# Patient Record
Sex: Female | Born: 1935 | Race: White | Hispanic: No | Smoking: Never smoker
Health system: Southern US, Community
[De-identification: ages and names within clinical notes are randomized; demographics above are authoritative.]

## PROBLEM LIST (undated history)

## (undated) DIAGNOSIS — I951 Orthostatic hypotension: Secondary | ICD-10-CM

## (undated) HISTORY — PX: NO PAST SURGERIES: SHX2092

## (undated) HISTORY — DX: Orthostatic hypotension: I95.1

---

## 2019-06-13 ENCOUNTER — Ambulatory Visit: Payer: Medicare HMO | Attending: Internal Medicine

## 2019-06-13 DIAGNOSIS — Z23 Encounter for immunization: Secondary | ICD-10-CM

## 2019-06-13 NOTE — Progress Notes (Signed)
   Covid-19 Vaccination Clinic  Name:  Anieya Helman [facility]    MRN: [id number] DOB: 10/06/1933  06/13/2019  Ms. Caughlin was observed post Covid-19 immunization for 15 minutes without incident. She was provided with Vaccine Information Sheet and instruction to access the V-Safe system.   Ms. Sottile was instructed to call 911 with any severe reactions post vaccine: Marland Kitchen Difficulty breathing  . Swelling of face and throat  . A fast heartbeat  . A bad rash all over body  . Dizziness and weakness   Immunizations Administered    Name Date Dose VIS Date Route   Pfizer COVID-19 Vaccine 06/13/2019  1:29 PM 0.3 mL 02/18/2019 Intramuscular   Manufacturer: [employer], [employer]   Lot: [id number]   NDC: [address]-8

## 2019-07-05 ENCOUNTER — Ambulatory Visit: Payer: Medicare HMO | Attending: Internal Medicine

## 2019-07-05 DIAGNOSIS — Z23 Encounter for immunization: Secondary | ICD-10-CM

## 2019-07-05 NOTE — Progress Notes (Signed)
   Covid-19 Vaccination Clinic  Name:  Lisa Bird [facility]    MRN: [id number] DOB: [birth year]  07/05/2019  Lisa Bird was observed post Covid-19 immunization for 15 minutes without incident. She was provided with Vaccine Information Sheet and instruction to access the V-Safe system.   Lisa Bird was instructed to call 911 with any severe reactions post vaccine: Marland Kitchen Difficulty breathing  . Swelling of face and throat  . A fast heartbeat  . A bad rash all over body  . Dizziness and weakness   Immunizations Administered    Name Date Dose VIS Date Route   Pfizer COVID-19 Vaccine 07/05/2019  1:22 PM 0.3 mL 05/04/2018 Intramuscular   Manufacturer: [employer], [employer]   Lot: [id number]   NDC: [address]-2

## 2019-09-18 DIAGNOSIS — I951 Orthostatic hypotension: Secondary | ICD-10-CM | POA: Insufficient documentation

## 2019-09-18 DIAGNOSIS — Z7189 Other specified counseling: Secondary | ICD-10-CM | POA: Insufficient documentation

## 2019-09-18 NOTE — Progress Notes (Signed)
Cardiology Office Note   Date:  09/19/2019   ID:  Lisa Bird, DOB 02-12-1934, MRN [id number]  PCP:  Hillery Aldo, PA-C  Cardiologist:   No primary care provider on file. Referring:  Juliet Rude*  Chief Complaint  Patient presents with  . Dizziness      History of Present Illness: Lisa Bird is a 86 y.o. female who was referred by Juliet Rude* for evaluation of orthostatic hypotension.  The patient has a remarkably benign past medical history and no cardiac history.  She has been having dizziness for the last several months.  She describes it as "feeling drunk."  She says that she gets lightheaded and feels like she can pass out.  It seems to be always at least in a seated position.  More often when she is standing and more often when she has a change in positions from lying to sitting to standing.  She will feel her heart racing.  She is actually had one episode of passing out with the hurt herself somewhat.  She is otherwise been able to avoid passing out by going to sit down quickly.  She is not having any chest pressure, neck or arm discomfort.  She is not having any shortness of breath, PND or orthopnea.  She was seen in the doctor's office recently and did have a noted orthostatic blood pressure drop of 22 mmHg.  She has been hydrating.  She does not really use a lot of salt.  She is not yet wearing compression stockings.  PMH: None  PSH:  None  Current Outpatient Medications  Medication Sig Dispense Refill  . Ascorbic Acid (VITAMIN C PO) Take by mouth.    Marland Kitchen VITAMIN D PO Take by mouth.     No current facility-administered medications for this visit.    Allergies:   Patient has no allergy information on record.    Social History:  The patient  reports that she has never smoked. She has never used smokeless tobacco.   Family History:  The patient's family history is noncontributory for early coronary artery  disease.  ROS:  Please see the history of present illness.   Otherwise, review of systems are positive for none.   All other systems are reviewed and negative.    PHYSICAL EXAM: VS:  BP 114/60   Pulse 70   Wt 96 lb 3.2 oz (43.6 kg)   SpO2 99%  , BMI There is no height or weight on file to calculate BMI. GENERAL:  Well appearing HEENT:  Pupils equal round and reactive, fundi not visualized, oral mucosa unremarkable NECK:  No jugular venous distention, waveform within normal limits, carotid upstroke brisk and symmetric, no bruits, no thyromegaly LYMPHATICS:  No cervical, inguinal adenopathy LUNGS:  Clear to auscultation bilaterally BACK:  No CVA tenderness CHEST:  Unremarkable HEART:  PMI not displaced or sustained,S1 and S2 within normal limits, no S3, no S4, no clicks, no rubs, no murmurs ABD:  Flat, positive bowel sounds normal in frequency in pitch, no bruits, no rebound, no guarding, no midline pulsatile mass, no hepatomegaly, no splenomegaly EXT:  2 plus pulses throughout, no edema, no cyanosis no clubbing SKIN:  No rashes no nodules NEURO:  Cranial nerves II through XII grossly intact, motor grossly intact throughout PSYCH:  Cognitively intact, oriented to person place and time    EKG:  EKG is ordered today. The ekg ordered today demonstrates sinus rhythm, rate 70,  axis within normal limits, intervals within normal limits, no acute ST-T wave changes.   Recent Labs: No results found for requested labs within last 8760 hours.    Lipid Panel No results found for: CHOL, TRIG, HDL, CHOLHDL, VLDL, LDLCALC, LDLDIRECT    Wt Readings from Last 3 Encounters:  09/19/19 96 lb 3.2 oz (43.6 kg)      Other studies Reviewed: Additional studies/ records that were reviewed today include: Labs, officer records. Review of the above records demonstrates:  Please see elsewhere in the note.     ASSESSMENT AND PLAN:  ORTHOSTATIC HYPOTENSION:   The patient clearly has orthostatic  hypotension symptoms and the documented blood pressure drop in her primary care office.  There is no clear obvious etiology to this and it is probably primary orthostasis.  Labs are thus far unremarkable although TSH is pending.  Electrolytes are unremarkable.  She was not anemic.  She would prefer conservative management.  I had a long discussion with her and her daughter about this.  She agrees to salt utilization and hydration.  She was given instructions on getting at least knee-high compression stockings and considering other.  She agrees to come back in 1 month and if she still having the symptoms and not able to avoid significant episodes that she would agree probably to midodrine.  She does mention some tachycardia but I do not think this is a primary event.  If she still mentioning this when she comes back she should probably have an event monitor.  We need to follow-up on the labs ordered by her primary provider when they are all available.  COVID EDUCATION: She has had vaccines.   Current medicines are reviewed at length with the patient today.  The patient does not have concerns regarding medicines.  The following changes have been made:  no change  Labs/ tests ordered today include: None  Orders Placed This Encounter  Procedures  . EKG 12-Lead     Disposition:   FU with APP in one month.     Signed, Rollene Rotunda, MD  09/19/2019 6:04 PM    [facility]

## 2019-09-19 ENCOUNTER — Ambulatory Visit: Payer: Medicare HMO | Admitting: Cardiology

## 2019-09-19 ENCOUNTER — Encounter: Payer: Self-pay | Admitting: Cardiology

## 2019-09-19 ENCOUNTER — Other Ambulatory Visit: Payer: Self-pay

## 2019-09-19 VITALS — BP 114/60 | HR 70 | Wt 96.2 lb

## 2019-09-19 DIAGNOSIS — I951 Orthostatic hypotension: Secondary | ICD-10-CM

## 2019-09-19 DIAGNOSIS — Z7189 Other specified counseling: Secondary | ICD-10-CM

## 2019-09-19 NOTE — Patient Instructions (Signed)
Medication Instructions:  The current medical regimen is effective;  continue present plan and medications.  *If you need a refill on your cardiac medications before your next appointment, please call your pharmacy*   Follow-Up: At [facility], you and your health needs are our priority.  As part of our continuing mission to provide you with exceptional heart care, we have created designated Provider Care Teams.  These Care Teams include your primary Cardiologist (physician) and Advanced Practice Providers (APPs -  Physician Assistants and Nurse Practitioners) who all work together to provide you with the care you need, when you need it.  We recommend signing up for the patient portal called "MyChart".  Sign up information is provided on this After Visit Summary.  MyChart is used to connect with patients for Virtual Visits (Telemedicine).  Patients are able to view lab/test results, encounter notes, upcoming appointments, etc.  Non-urgent messages can be sent to your provider as well.   To learn more about what you can do with MyChart, go to [email or web address].    Your next appointment:   1 month(s)  The format for your next appointment:   In Person  Provider:   You may see Dr.Hochrein or one of the following Advanced Practice Providers on your designated Care Team:    Theodore Demark, PA-C  Joni Reining, DNP, ANP  Cadence Fransico Michael, PA-C

## 2019-09-26 ENCOUNTER — Ambulatory Visit: Payer: Medicare HMO | Admitting: Neurology

## 2019-09-26 ENCOUNTER — Other Ambulatory Visit: Payer: Self-pay

## 2019-09-26 ENCOUNTER — Encounter: Payer: Self-pay | Admitting: Neurology

## 2019-09-26 VITALS — BP 110/59 | HR 70 | Ht 63.0 in | Wt 99.0 lb

## 2019-09-26 DIAGNOSIS — W19XXXA Unspecified fall, initial encounter: Secondary | ICD-10-CM

## 2019-09-26 DIAGNOSIS — R4189 Other symptoms and signs involving cognitive functions and awareness: Secondary | ICD-10-CM

## 2019-09-26 DIAGNOSIS — G3184 Mild cognitive impairment, so stated: Secondary | ICD-10-CM | POA: Diagnosis not present

## 2019-09-26 DIAGNOSIS — R55 Syncope and collapse: Secondary | ICD-10-CM

## 2019-09-26 DIAGNOSIS — G9340 Encephalopathy, unspecified: Secondary | ICD-10-CM | POA: Diagnosis not present

## 2019-09-26 DIAGNOSIS — F0391 Unspecified dementia with behavioral disturbance: Secondary | ICD-10-CM

## 2019-09-26 DIAGNOSIS — G309 Alzheimer's disease, unspecified: Secondary | ICD-10-CM

## 2019-09-26 DIAGNOSIS — R718 Other abnormality of red blood cells: Secondary | ICD-10-CM

## 2019-09-26 NOTE — Progress Notes (Addendum)
[facility]    Provider:  Dr Lucia GaskinsAhern Requesting Provider: Juliet RudeLivengood, Jessica J, P* Primary Care Provider:  Hillery AldoLivengood, Jessica J, PA-C  CC:  Mild cognitive impairement  HPI:  Lisa Bird is a 86 y.o. female here as requested by Hillery AldoLivengood, Jessica J, Demetrius CharityP* for mild cognitive impairment.  Past medical history orthostatic hypotension, mild cognitive impairment with reported memory issues from patient and daughter, unsteady gait.  I reviewed [employer] Levengood's notes, both patient and daughter reported that cognitive decline going on for "a while", patient forgetting things and repeats questions, sometimes patient thinks she has another house just like her somewhere else, and patient states she does think she has 2 houses but realizes this is probably not true, daughter worried patient may get lost driving, she has had eye examinations and vision is fine, MoCA score 18 out of 30.  CBC, CMP, B12, TSH and vitamin D were checked; TSH normal, CMP with BUN 14 and creatinine 0.82, CBC showed elevated MCV otherwise normal, B12 was 334, I do not see methylmalonic acid. Home health PT was consulted for unsteady gait, patient had a recent fall, unsteady gait and had 4 staples removed from the posterior head well-healed after fall, patient reported she fell outside, fell onto a curb-gutter in her right ribs and shoulder, she had increased pain with deep breath, no loss of consciousness; patient also stated she is always had "shakes", she hit the head in the bottom very hard, was alone at home bending over stood upright and fell.  Patient is here with her daughter who provides much information, they both agree she is recently been having memory concerns, she is forgetful of names, where she put things, has not been driving recently, she had a fall July 2 and they found that she was having orthostatic hypotension and is being followed by cardiology for that, syncopal event and head trauma due to  orthostasis. Daughter also provides information.   She feels dizzy a lot, she fell and fell backwards and her head hit twice, she had a laceration, she has had the dizziness for "a while" walking around and getting up is the worst, diagnosed with orthostatic hypotension, she has started drinking more fluids and eating better and her orthostasis is improved. She has had mood changes, especially since her husband passed, she had to move closer to family and move from her home town [place] and it has been hard for her. She went to a grief class a few times but never really healed from that. Her daughter cares for her. She says it is worse by herself. She does not want any medication. She isn't driving because they are afraid she may get lost, she doesn't know her way around. She sits at home, hard to get out to Covid and make friends. She moved last July to [place]. She is new to everything, she is forgetting new names, she says it isn't easy, she is repeating the same questions during the day and forgetting more than she should, she doesn't remember her [employer] class name even though she has been going multiple times, patient says she never learned the name. She is overwhelmed. No FHx of dementia. She thinks she has 2 identical houses. She appears to know and be embarassed by this and won't discuss with psychiatry and thought someone was living under her house. No personality changes. She has always been grumpy. She had a bad childhood.   Reviewed notes, labs and imaging from outside  physicians, which showed: see above   Review of Systems: Patient complains of symptoms per HPI as well as the following symptoms: "mild cognitive impairment". Pertinent negatives and positives per HPI. All others negative.   Social History   Socioeconomic History   Marital status: Widowed    Spouse name: Not on file   Number of children: Not on file   Years of education: Not on file   Highest education  level: Not on file  Occupational History   Not on file  Tobacco Use   Smoking status: Never Smoker   Smokeless tobacco: Never Used  Substance and Sexual Activity   Alcohol use: Not on file   Drug use: Not on file   Sexual activity: Not on file  Other Topics Concern   Not on file  Social History Narrative   Not on file   Social Determinants of Health   Financial Resource Strain:    Difficulty of Paying Living Expenses:   Food Insecurity:    Worried About Running Out of Food in the Last Year:    Merchant navy officer of Food in the Last Year:   Transportation Needs:    Freight forwarder (Medical):    Lack of Transportation (Non-Medical):   Physical Activity:    Days of Exercise per Week:    Minutes of Exercise per Session:   Stress:    Feeling of Stress :   Social Connections:    Frequency of Communication with Friends and Family:    Frequency of Social Gatherings with Friends and Family:    Attends Religious Services:    Active Member of Clubs or Organizations:    Attends Engineer, structural:    Marital Status:   Intimate Partner Violence:    Fear of Current or Ex-Partner:    Emotionally Abused:    Physically Abused:    Sexually Abused:     Family History  Problem Relation Age of Onset   Syncope episode Mother    Stroke Mother     Past Medical History:  Diagnosis Date   Orthostatic hypotension     Patient Active Problem List   Diagnosis Date Noted   Orthostatic hypotension 09/18/2019   Educated about COVID-19 virus infection 09/18/2019    No past surgical history on file.  Current Outpatient Medications  Medication Sig Dispense Refill   Ascorbic Acid (VITAMIN C PO) Take by mouth.     VITAMIN D PO Take by mouth.     No current facility-administered medications for this visit.    Allergies as of 09/26/2019   (Not on File)    Vitals: BP (!) 110/59    Pulse 70    Ht 5\' 3"  (1.6 m)    Wt 99 lb (44.9 kg)    BMI  17.54 kg/m  Last Weight:  Wt Readings from Last 1 Encounters:  09/26/19 99 lb (44.9 kg)   Last Height:   Ht Readings from Last 1 Encounters:  09/26/19 5\' 3"  (1.6 m)     Physical exam: Exam: Gen: NAD, conversant, well nourised, thin and frail looking, well groomed                     CV: RRR, no MRG. No Carotid Bruits. No peripheral edema, warm, nontender Eyes: Conjunctivae clear without exudates or hemorrhage  Neuro: Detailed Neurologic Exam  Speech:    Speech is normal; fluent and spontaneous with impaired comprehension.  Cognition:     MMSE - Mini  Mental State Exam 09/26/2019  Orientation to time 2  Orientation to Place 1  Registration 3  Attention/ Calculation 5  Recall 0  Language- name 2 objects 2  Language- repeat 1  Language- follow 3 step command 3  Language- read & follow direction 1  Write a sentence 1  Copy design 1  Total score 20    Cranial Nerves:    The pupils are equal, round, and reactive to light. Attempted fundoscopy could not visualize.  Visual fields are full to finger confrontation. Extraocular movements are intact. Trigeminal sensation is intact and the muscles of mastication are normal. The face is symmetric. The palate elevates in the midline. Hearing intact. Voice is normal. Shoulder shrug is normal. The tongue has normal motion without fasciculations.   Coordination:    Normal finger to nose and heel to shin.   Gait:    Heel-toe normal,  tandem gait with slight imbalance but overall intact   Motor Observation:    No asymmetry, no atrophy, and no involuntary movements noted. Tone:    Normal muscle tone.    Posture:    Posture is normal. normal erect    Strength:    Strength is V/V in the upper and lower limbs.      Sensation: intact to LT     Reflex Exam:  DTR's: Absent AJs otherwise deep tendon reflexes in the upper and lower extremities are normal bilaterally.   Toes:    The toes are equiv bilaterally.   Clonus:    Clonus  is absent.    Assessment/Plan:  86 y.o. female here as requested by Hillery Aldo, P* for mild cognitive impairment.  Past medical history orthostatic hypotension, mild cognitive impairment with reported memory issues from patient and daughter, unsteady gait.  I reviewed [employer] notes, both patient and daughter reported that cognitive decline going on for "a while", patient forgetting things and repeats questions, sometimes patient thinks she has another house just like her somewhere else, and patient states she does think she has 2 houses but realizes this is probably not true, daughter worried patient may get lost driving, she has had eye examinations and vision is fine, MoCA score 18 out of 30.MMSE 20/30.   Patient does appear to have cognitive difficulties more than stated age, I discussed with daughter alone, I do not think patient should be driving but also discussed formal driving testing in the future if patient improves. Her delusions are also concerning, can be first seen in Lewy Body dementia, cannot rule out Vascular dementia or Alzheimer's at this time. She also has depression and mood disorder, husband past away and she moved a year ago.   -Recommend Therapy for her grief and possible depression/mood disorder - Discussed formal memory testing - Need MRI of the brain - discussed formal driving eval to consider in the future - Discussed PET scan and gave information (FDG PET Scan can help distinguish different types of dementia) FTD vs Alzheimers vs lewy body - Gave lots of resources for [place], discussed silver sneakers, memory cafe and other dementia resources/packet - Discussed dementia - we will schedule follow up when workup completed - no driving at this time - she declines any medication - at this time still gathering mor einformation on the new [employer] drug - after workup we may consider clinical trial her ein our office  Orders Placed This Encounter    Procedures   MR BRAIN W WO CONTRAST   B12 and Folate  Panel   Methylmalonic acid, serum   Vitamin B1   Homocysteine   Basic Metabolic Panel   Ambulatory referral to Neuropsychology   No orders of the defined types were placed in this encounter.   Cc: Hillery Aldo, P*,  Hillery Aldo, PA-C  Naomie Dean, MD  [facility] [address] [place], [place] [address]  Phone [phone] Fax [phone]  I spent 90 minutes of face-to-face and non-face-to-face time with patient on the  1. Mild cognitive impairment   2. Elevated MCV   3. Syncope, unspecified syncope type   4. Fall, initial encounter   5. Encephalopathy    diagnosis.  This included previsit chart review, lab review, study review, order entry, electronic health record documentation, patient education on the different diagnostic and therapeutic options, counseling and coordination of care, risks and benefits of management, compliance, or risk factor reduction

## 2019-09-26 NOTE — Patient Instructions (Addendum)
Formal memory testing - Dr. Marvetta Gibbons office Consider formal driving testing - gave information MRI of the brain and blood work - will schedule PET Scan (can tell the difference between different forms of dementia)   Alzheimer's Disease Alzheimer's disease is a brain disease that affects memory, thinking, language, and behavior. People with Alzheimer's disease lose mental abilities, and the disease gets worse over time. Alzheimer's disease is a form of dementia. What are the causes? This condition develops when a protein called beta-amyloid forms deposits in the brain. It is not known what causes these deposits to form. Alzheimer's disease may also be caused by a gene mutation that is inherited from one parent or both parents. A gene mutation is a harmful change in a gene. Not everyone who inherits the genetic mutation will get the disease. What increases the risk? You are more likely to develop this condition if you:  Are older than age 8.  Have a family history of dementia.  Have had a brain injury.  Have heart or blood vessel disease.  Have had a stroke.  Have high blood pressure or high cholesterol.  Have diabetes. What are the signs or symptoms? Symptoms of this condition may happen in three stages, which often overlap. Early stage In this stage, you may continue to be independent. You may still be able to drive, work, and be social. Symptoms in this stage include:  Minor memory problems, such as forgetting a name or what you read.  Difficulty with: ? Paying attention. ? Communicating. ? Doing familiar tasks. ? Problem solving or doing calculations. ? Following instructions. ? Learning new things.  Anxiety.  Social withdrawal.  Loss of motivation. Moderate stage In this stage, you will start to need care. Symptoms in this stage include:  Difficulty with expressing thoughts.  Memory loss that affects daily life. This can include forgetting: ? Your address  or phone number. ? Recent events that have happened. ? Parts of your personal history, such as where you went to school.  Confusion about where you are or what time it is.  Difficulty in judging distance.  Changes in personality, mood, and behavior. You may be moody, irritable, angry, frustrated, fearful, anxious, or suspicious.  Poor reasoning and judgment.  Delusions or hallucinations.  Changes in sleep patterns.  Wandering and getting lost, even in familiar places. Severe stage In the final stage, you will need help with your personal care and daily activities. Symptoms in this stage include:  Worsening memory loss.  Personality changes.  Loss of awareness of your surroundings.  Changes in physical abilities, including the ability to walk, sit, and swallow.  Difficulty in communicating.  Inability to control your bladder and bowels.  Increasing confusion.  Increasing behavior changes. How is this diagnosed? This condition is diagnosed by a health care provider who specializes in diseases of the nervous system (neurologist). Other causes of dementia may also be ruled out. Your health care provider will talk with you and your family, friends, or caregivers about your history and symptoms. A thorough medical history will be taken, and you will have a physical exam and tests. Tests may include:  Lab tests, such as blood or urine tests.  Imaging tests, such as a CT scan, a PET scan, or an MRI.  A lumbar puncture. This test involves removing and testing a small amount of the fluid that surrounds the brain and spinal cord.  An electroencephalogram (EEG). In this test, small metal discs are used to measure  electrical activity in the brain.  Memory tests, cognitive tests, and neuropsychological tests. These tests evaluate brain function.  Genetic testing may be done if you have early onset of the disease (before age 78) or if other family members have the disease. How is  this treated? At this time, there is no treatment to cure Alzheimer's disease or stop it from getting worse. The goals of treatment are:  To slow down symptoms of the disease, if possible.  To manage behavioral changes.  To provide you with a safe environment.  To help manage daily life for you and your caregivers. The following treatment options are available:  Medicines. Medicines may help to slow down memory loss and manage behavioral symptoms.  Cognitive therapy. Cognitive therapy provides you with education, support, and memory aids. It is most helpful in the early stages of the condition.  Counseling or spiritual guidance. It is normal to have a lot of feelings, including anger, relief, fear, and isolation. Counseling and guidance can help you deal with these feelings.  Caregiving. This involves having caregivers help you with your daily activities.  Family support groups. These provide education, emotional support, and information about community resources to family members who are taking care of you. Follow these instructions at home:  Medicines  Take over-the-counter and prescription medicines only as told by your health care provider.  Use a pill organizer or pill reminder to help you manage your medicines.  Avoid taking medicines that can affect thinking, such as pain medicines or sleeping medicines. Lifestyle  Make healthy lifestyle choices: ? Be physically active as told by your health care provider. Regular exercise may help improve symptoms. ? Do not use any products that contain nicotine or tobacco, such as cigarettes, e-cigarettes, and chewing tobacco. If you need help quitting, ask your health care provider. ? Do not drink alcohol. ? Eat a healthy diet. ? Practice stress-management techniques when you get stressed. ? Stay social.  Drink enough fluid to keep your urine pale yellow.  Make sure to get quality sleep. ? Avoid taking long naps during the day.  Take short naps of 30 minutes or less if needed. ? Keep your sleeping area dark and cool. ? Avoid exercising during the few hours before you go to bed. ? Avoid caffeine products in the afternoon and evening. General instructions  Work with your health care provider to determine what you need help with and what your safety needs are.  If you were given a bracelet that identifies you as a person with memory loss or tracks your location, make sure to wear it at all times.  Talk with your health care provider about whether it is safe for you to drive.  Work with your family to make important decisions, such as advance directives, medical power of attorney, or a living will.  Keep all follow-up visits as told by your health care provider. This is important. Where to find more information  The Alzheimer's Association: Call the 24-hour helpline at (267)802-8046, or visit LimitLaws.hu Contact a health care provider if:  You have nausea, vomiting, or trouble with eating.  You have dizziness or weakness.  You or your family members become concerned for your safety. Get help right away if:  You feel depressed or sad, or feel that you want to harm yourself.  You develop chest pain or difficulty with breathing.  You pass out. If you ever feel like you may hurt yourself or others, or have thoughts about taking  your own life, get help right away. You can go to your nearest emergency department or call:  Your local emergency services (911 in the [place]  A suicide crisis helpline, such as the National Suicide Prevention Lifeline at 807 192 1613. This is open 24 hours a day. Summary  Alzheimer's disease is a brain disease that affects memory, thinking, language, and behavior. Alzheimer's disease is a form of dementia.  This condition is diagnosed by a specialist in diseases of the nervous system (neurologist).  At this time, there is no treatment to cure Alzheimer's disease or stop it from  getting worse. The goals of treatment are to slow memory loss and help you manage any symptoms.  Work with your family to make important decisions, such as advance directives, medical power of attorney, or a living will. This information is not intended to replace advice given to you by your health care provider. Make sure you discuss any questions you have with your health care provider. Document Revised: 02/02/2018 Document Reviewed: 02/02/2018 Elsevier Patient Education  2020 [employer]  Lewy Body Dementia Lewy body dementia, also called dementia with Lewy bodies, is a condition that affects the way the brain functions. It is one form of dementia. In this condition, proteins called Lewy bodies build up in certain areas of the brain. This causes problems with:  Memory.  Decision making.  Behavior.  Speaking.  Thinking.  Movements and balance.  Problem solving. This condition is progressive, which means that it gets worse with time (is degenerative) and cannot be reversed. What are the causes? This condition is caused by the buildup of Lewy bodies in brain cells in areas of the brain that control memory, thinking, and movement. It is not known what causes the Lewy bodies to build up. What increases the risk? You are more likely to develop this condition if you:  Have a family history of Lewy body dementia or Parkinson's disease.  Are 23 years old or older.  Are female. What are the signs or symptoms? Symptoms of this condition may include:  Symptoms of dementia, such as: ? Trouble with memory. ? Trouble paying attention. ? Problems with planning and organizing. ? Problems with judgment. ? Behavioral problems.  Symptoms of Parkinson's disease, such as: ? Shaking movements that you cannot control (tremor). Tremors usually start in a hand or foot when you are resting (resting tremor). ? Stooped posture. ? Slowing of movement. ? Stiff muscles (rigidity). ? Loss of  balance and stability when standing.  Seeing things that are not there (hallucinating).  Changes in memory, attention, and concentration that come and go (fluctuation).  Sleep problems, such as acting out dreams while you are asleep. How is this diagnosed? This condition is diagnosed by a specialist who diagnoses and treats this condition (neurologist). Your health care provider will talk with you and your family, friends, or caregivers about your history and symptoms. A thorough medical history will be taken, and you will have a physical exam and tests. Tests may include:  Lab tests, such as blood or urine tests.  Imaging tests, such as a CT scan, a PET scan, or an MRI.  A test that involves removing and testing a small amount of the fluid that surrounds the brain and spinal cord (lumbar puncture).  A test where small metal discs are used to measure electrical activity in the brain (electroencephalogram or EEG).  Tests that evaluate brain function, such as memory tests, cognitive tests, and neuropsychological tests. How is this  treated? There is no cure for this condition. Treatment focuses on managing your symptoms. Treatment may include:  Medicines. Everyone responds to medicines differently. Your response may change over time. Work with your health care provider to find the best medicines for you.  Speech, occupational, and physical therapy. Your health care provider can help direct you to support groups, organizations, and other health care providers who can help with decisions about your care. Follow these instructions at home: Medicine  Take over-the-counter and prescription medicines only as told by your health care provider.  To help you manage your medicines, use a pill organizer or pill reminder.  Avoid taking medicines that can affect thinking, such as pain medicines or sleeping medicines. Lifestyle  Make healthy lifestyle choices: ? Be physically active as told by  your health care provider. ? Do not use any products that contain nicotine or tobacco, such as cigarettes, e-cigarettes, and chewing tobacco. If you need help quitting, ask your health care provider. ? Try to practice stress-management techniques when you experience stress, such as mindfulness, yoga, or deep breathing. ? Stay socially connected. Talk regularly with other people, such as family, friends, and neighbors.  Make sure you sleep well. These tips can help you get a good night's rest: ? Avoid napping during the day. ? Keep your sleeping area dark and cool. ? Avoid exercising a few hours before you go to bed. ? Avoid caffeine products in the evening. Eating and drinking  Do not drink alcohol.  Drink enough fluid to keep your urine pale yellow.  Eat a healthy diet. Safety      Work with your health care provider to determine what you need help with and what your safety needs are.  If you have trouble moving around, use a cane or walker as told by your health care provider.  Make sure your home environment is safe. To do this: ? Remove things that can be a tripping hazard, such as throw rugs or clutter. ? Install grab bars and railings in your home to prevent falls.  Talk with your health care provider about if it is safe for you to drive.  If you were given a bracelet that identifies you as a person with memory loss or tracks your location, make sure to wear it at all times. General instructions  Work with your family to make important decisions, such as advance directives, medical power of attorney, or a living will.  Keep all follow-up visits as told by your health care provider. This is important. Contact a health care provider if you have:  A fever.  Problems with choking or swallowing.  Any symptoms of a new or different illness.  New or worsening trouble with sleeping or increased daytime sleepiness.  New or worsening confusion. Get help right away  if:  You feel depressed, sad, or feel that you want to harm yourself.  Your family members become concerned for your safety. If you ever feel like you may hurt yourself or others, or have thoughts about taking your own life, get help right away. You can go to your nearest emergency department or call:  Your local emergency services (911 in the [place]  A suicide crisis helpline, such as the National Suicide Prevention Lifeline at 9282433744. This is open 24 hours a day. Summary  Dementia is a condition that affects the way the brain functions. It often affects memory and thinking.  Lewy body dementia is a degenerative dementia.  This condition is  caused by the buildup of proteins called Lewy bodies in brain cells. It is not known what causes the Lewy bodies to build up.  Work with your health care provider to determine what you need help with and what your safety needs are.  Your health care provider can help direct you to support groups, organizations, and other health care providers who can help with decisions about your care. This information is not intended to replace advice given to you by your health care provider. Make sure you discuss any questions you have with your health care provider. Document Revised: 04/22/2018 Document Reviewed: 04/22/2018 Elsevier Patient Education  2020 [employer].  Vascular Dementia Dementia is a condition in which a person has problems with thinking, memory, and behavior that are severe enough to interfere with daily life. Vascular dementia is a type of dementia. It results from brain damage that is caused by the brain not getting enough blood. This condition may also be called vascular cognitive impairment. What are the causes? Vascular dementia is caused by conditions that lessen blood flow to the brain. Common causes of this condition include:  Multiple small strokes. These may happen without symptoms (silent stroke).  Major  stroke.  Damage to small blood vessels in the brain (cerebral small vessel disease). What increases the risk? The following factors may make you more likely to develop this condition:  Having had a stroke.  Having high blood pressure (hypertension) or high cholesterol.  Having a disease that affects the heart or blood vessels.  Smoking.  Having diabetes.  Having metabolic syndrome.  Being obese.  Not being active.  Having depression.  Being over age 63. What are the signs or symptoms? Symptoms can vary from one person to another. Symptoms may be mild or severe depending on the amount of damage and which parts of the brain have been affected. Symptoms may begin suddenly or may develop slowly. Mental symptoms of vascular dementia may include:  Confusion.  Memory problems.  Poor attention and concentration.  Trouble understanding speech.  Depression.  Personality changes.  Trouble recognizing familiar people.  Agitation or aggression.  Paranoia.  Delusions or hallucinations. Physical symptoms of vascular dementia may include:  Weakness.  Poor balance.  Loss of bladder or bowel control (incontinence).  Unsteady walking (gait).  Speaking problems. Behavioral symptoms of vascular dementia may include:  Getting lost in familiar places.  Problems with planning and judgment.  Trouble following instructions.  Social problems.  Emotional outbursts.  Trouble with daily activities and self-care.  Problems handling money. Symptoms may remain stable, or they may get worse over time. Symptoms of vascular dementia may be similar to those of Alzheimer's disease. The two conditions can occur together (mixed dementia). How is this diagnosed? Your health care provider will consider your medical history and symptoms or changes that are reported by friends and family. Your health care provider will do a physical exam and may order lab tests or other tests that  check brain and nervous system function. Tests that may be done include:  Blood tests.  Brain imaging tests.  Tests of movement, speech, and other daily activities (neurological exam).  Tests of memory, thinking, and problem-solving (neuropsychological or neurocognitive testing). There is not a specific test to diagnose vascular dementia. Diagnosis may involve several specialists. These may include:  A health care provider who specializes in the brain and nervous system (neurologist).  A health provider who specializes in understanding how problems in the brain can alter behavior  and cognitive function (neuropsychologist). How is this treated? There is no cure for vascular dementia. Brain damage that has already occurred cannot be reversed. Treatment depends on:  How severe the condition is.  Which parts of your brain have been affected.  Your overall health. Treatment measures aim to:  Treat the underlying cause of vascular dementia and manage risk factors. This may include: ? Controlling blood pressure. ? Lowering cholesterol. ? Treating diabetes. ? Quitting smoking. ? Losing weight or maintaining a healthy weight. ? Eating a healthy, balanced diet. ? Getting regular exercise.  Manage symptoms.  Prevent further brain damage.  Improve the person's health and quality of life. Treatment for dementia may involve a team of health care providers, including:  A neurologist.  A provider who specializes in disorders of the mind (psychiatrist).  A provider who specializes in helping people learn daily living skills (occupational therapist).  A provider who focuses on speech and language changes (Doctor, general practice).  A heart specialist (cardiologist).  A provider who helps people learn how to manage physical changes, such as movement and walking (exercise physiologist or physical therapist). Follow these instructions at home: Lifestyle  People with vascular dementia may  need regular help at home or daily care from a family member or home health care worker. Home care for a person with vascular dementia depends on what caused the condition and how severe the symptoms are. General guidelines for caregivers include:  Help the person with dementia remember people, appointments, and daily activities.  Help the person with dementia manage his or her medicines.  Help family and friends learn about ways to communicate with the person with dementia.  Create a safe living space to reduce the risk of injury or falls.  Find a support group to help caregivers and family cope with the effects of dementia.  General instructions  Help the person take over-the-counter and prescription medicines only as told by the health care provider.  Follow the health care provider's instructions for treating the condition that caused the dementia.  Make sure the person keeps all follow-up visits as told by the health care provider. This is important. Contact a health care provider if:  A fever develops.  New behavioral problems develop.  Problems with swallowing develop.  Confusion gets worse.  Sleepiness gets worse. Get help right away if:  Loss of consciousness occurs.  There is a sudden loss of speech, balance, or thinking ability.  New numbness or paralysis occurs.  Sudden, severe headache occurs.  Vision is lost or suddenly gets worse in one or both eyes. Summary  Vascular dementia is a type of dementia. It results from brain damage that is caused by the brain not getting enough blood.  Vascular dementia is caused by conditions that lessen blood flow to the brain. Common causes of this condition include stroke and damage to small blood vessels in the brain.  Treatment focuses on treating the underlying cause of vascular dementia and managing any risk factors.  People with vascular dementia may need regular help at home or daily care from a family member or  home health care worker.  Contact a health care provider if you or your caregiver notice any new symptoms. This information is not intended to replace advice given to you by your health care provider. Make sure you discuss any questions you have with your health care provider. Document Revised: 11/25/2017 Document Reviewed: 11/26/2017 Elsevier Patient Education  2020 [employer].  Recommendations to prevent or slow  progression of cognitive decline:   Exercise You should increase exercise 30 to 45 minutes per day at least 3 days a week although 5 to 7 would be preferred. Any type of exercise (including walking) is acceptable although a recumbent bicycle may be best if you are unsteady. Disease related apathy can be a significant roadblock to exercise and the only way to overcome this is to make it a daily routine and perhaps have a reward at the end (something your loved one loves to eat or drink perhaps) or a personal trainer coming to the home can also be very useful. In general a structured, repetitive schedule is best.   Cardiovascular Health: You should optimize all cardiovascular risk factors (blood pressure, sugar, cholesterol) as vascular disease such as strokes and heart attacks can make memory problems much worse.   Diet: Eating a heart healthy (Mediterranean) diet is also a good idea; fish and poultry instead of red meat, nuts (mostly non-peanuts), vegetables, fruits, olive oil or canola oil (instead of butter), minimal salt (use other spices to flavor foods), whole grain rice, bread, cereal and pasta and wine in moderation.  General Health: Any diseases which effect your body will effect your brain such as a pneumonia, urinary infection, blood clot, heart attack or stroke. Keep contact with your primary care doctor for regular follow ups.  Sleep. A good nights sleep is healthy for the brain. Seven hours is recommended. If you have insomnia or poor sleep habits see the recommendations  below  Tips: Structured and consistent daytime and nighttime routine, including regular wake times, bedtimes, and mealtimes, will be important for the patient to avoid confusion. Keeping frequently used items in designated places will help reduce stress from searching. If there are worries about getting lost do not let the patient leave home unaccompanied. They might benefit from wearing an identification bracelet that will help others assist in finding home if they become lost. Information about nationwide safe return services and other helpful resources may be obtained through the Alzheimer's Association helpline at 1800-(702) 662-9721.  Finances, Power of Restaurant manager, fast food Directives: You should consider putting legal safeguards in place with regard to financial and medical decision making. While the spouse always has power of attorney for medical and financial issues in the absence of any form, you should consider what you want in case the spouse / caregiver is no longer around or capable of making decisions.   [place] : MovieEvening.com.au.pdf  Or Google "[facility]" AND "An Proofreader for [employer]  Other States: PainBasics.com.au  The signature on these forms should be notarized.   DRIVING:   Driving only during the day Drive only to familiar Locations Avoid driving during bad weather  If you would like to be tested to see if you are driving safely, [facility] has a Clinical Driving Evaluation. To schedule an appointment call [phone].                RESOURCES:  Memory Loss: Improve your short term memory By Laverle Patter  The Alzheimer's Reading Room http://www.alzheimersreadingroom.com/   The Alzheimer's Compendium http://www.alzcompend.info/  [employer] www.dukefamilysupport.UJW 8311877442  Recommended  resources for caregivers (All can be purchased on [employer]):  1) A Caregiver's Guide to Dementia: Using Activities and Other Strategies to Prevent, Reduce and Manage Behavioral Symptoms by Mahala Menghini. [place] and [employer]   2) A Caregiver's Guide to Owens & Minor Dementia by Briant Sites MS BSN and Velia Meyer   3)  What If It's Not Alzheimer's?: A Caregiver's Guide to Dementia by Neila GearGary Radin (Author), Roberts GaudyLisa Radin (Editor)  3) The 36 hour day by Rabins and Mace  4) Understanding Difficult Behaviors by Gerre Scullobinson, Spencer and White  Online course for helping caregivers reduce stress, guilt and frustration called the Caregivers Helpbook. The website is www.powerfultoolsforcaregivers.org  As a caregiver you are a Government social research officerproblem-solver. Problems you face as a caregiver are usually unique to your situation and the way your loved-one's disease manifests itself. The best way to use these books is to look at the Table of Contents and read any chapters of interest or that apply to challenges you are having as a caregiver.  NATIONAL RESOURCES: For more information on neurological disorders or research programs funded by the General Millsational Institute of Neurological Disorders and Stroke, contact the Institute's GafferBrain Resources and Information Network (BRAIN) at: BRAIN [address] [place], [place] [id number] [phone] (toll-free) ToledoAutomobile.co.ukwww.ninds.nih.gov  Information on dementia is also available from the following organizations: Alzheimer's Disease Education and Referral (ADEAR) Center [employer]ational Institute on Aging [address] [place] [address] [phone] (toll-free) CashCowGambling.bewww.nia.nih.gov/alzheimers  Alzheimer's Association [address] [place], [place] [phone] (toll-free, 24-hour helpline) [phone] (TDD) LimitLaws.huwww.alz.org  Alzheimer's Foundation of [place] [address], 7th Floor [place], [place] [address] [phone] (toll-free) www.alzfdn.org   Alzheimer's Drug Discovery Foundation [address], [address] [place], [place] [address] [phone] www.alzdiscovery.org  Association for Frontotemporal Degeneration [address] [place], [place] [address] [phone] (toll-free) www.theaftd.[facility]  BrightFocus Foundation [address] [place], [place] [address] [phone] (toll-free) www.brightfocus.org/alzheimers  Earl GalaJohn Douglas French Alzheimer's Foundation [address], [address] [place], [place] [address] [phone] www.BushWebsites.nljdfaf.org  Lewy Body Dementia Association [address], [place], [place] [address] [phone] (toll-free LBD Caregiver Link) www.lbda.[facility]  National Institute of Mental Health [address], [address] [place], [place] [phone] [phone] (toll-free) [phone] [facility](TTY) http://www.maynard.net/www.nimh.nih.gov  National Organization for Rare Disorders [address] [place], [place] [address] [phone]) (toll-free) www.rarediseases.org  The Dementias: Hope Through Research was jointly produced by the General Millsational Institute of Neurological Disorders and Stroke (NINDS) and the General Millsational Institute on Aging (NIA), both part of the Occidental Petroleumational Institutes of Health, the H. J. Heinznation's medical research agency--supporting scientific studies that turn discovery into health. NINDS is the nation's leading funder of research on the brain and nervous system. The NINDS mission is to reduce the burden of neurological disease. For more information and resources, visit ToledoAutomobile.co.ukwww.ninds.nih.gov [1] or call [phone]. NIA leads the federal government effort conducting and supporting research on aging and the health and well-being of older people. NIA's Alzheimer's Disease Education and Referral (ADEAR) Center offers information and publications on dementia and caregiving for families, caregivers, and professionals. For more information, visit  CashCowGambling.bewww.nia.nih.gov/alzheimers [2] or call [phone]. Also available from NIA are publications and information about Alzheimer's disease as well as the booklets Frontotemporal Disorders: Information for Patients, Families, and Caregivers and Lewy Body Dementia: Information for Patients, Families, and Professionals. Source URL: VoiceZap.ishttp://www.nia.nih.gov/alzheimers/publication/dementias/resources

## 2019-09-27 LAB — SPECIMEN STATUS REPORT

## 2019-09-28 ENCOUNTER — Telehealth: Payer: Self-pay | Admitting: Neurology

## 2019-09-28 LAB — BASIC METABOLIC PANEL
BUN/Creatinine Ratio: 19 (ref 12–28)
BUN: 16 mg/dL (ref 8–27)
CO2: 24 mmol/L (ref 20–29)
Calcium: 9 mg/dL (ref 8.7–10.3)
Chloride: 106 mmol/L (ref 96–106)
Creatinine, Ser: 0.86 mg/dL (ref 0.57–1.00)
GFR calc Af Amer: 72 mL/min/{1.73_m2} (ref >59)
GFR calc non Af Amer: 63 mL/min/{1.73_m2} (ref >59)
Glucose: 92 mg/dL (ref 65–99)
Potassium: 4.3 mmol/L (ref 3.5–5.2)
Sodium: 141 mmol/L (ref 134–144)

## 2019-09-28 LAB — METHYLMALONIC ACID, SERUM: Methylmalonic Acid: 113 nmol/L (ref 0–378)

## 2019-09-28 LAB — HOMOCYSTEINE: Homocysteine: 10.9 umol/L (ref 0.0–21.3)

## 2019-09-28 LAB — VITAMIN B1

## 2019-09-28 LAB — B12 AND FOLATE PANEL
Folate: >20 ng/mL (ref >3.0)
Vitamin B-12: 938 pg/mL (ref 232–1245)

## 2019-09-28 NOTE — Telephone Encounter (Signed)
-----   Message from Anson Fret, MD sent at 09/27/2019  5:36 PM EDT ----- Labs normal thanks

## 2019-09-28 NOTE — Telephone Encounter (Signed)
Called the patient and there was no answer. LVM advising the pt of normal lab finding's. Instructed the patient to call back if she had questions.

## 2019-09-30 ENCOUNTER — Encounter: Payer: Self-pay | Admitting: Psychology

## 2019-10-26 ENCOUNTER — Ambulatory Visit
Admission: RE | Admit: 2019-10-26 | Discharge: 2019-10-26 | Disposition: A | Discharge status: Home / Self Care | Payer: Medicare HMO | Source: Ambulatory Visit | Attending: Neurology | Admitting: Neurology

## 2019-10-26 DIAGNOSIS — G9340 Encephalopathy, unspecified: Secondary | ICD-10-CM

## 2019-10-26 MED ORDER — GADOBENATE DIMEGLUMINE 529 MG/ML IV SOLN
9.0000 mL | Freq: Once | INTRAVENOUS | Status: AC | PRN
  Administered 2019-10-26: 9 mL via INTRAVENOUS

## 2019-10-27 ENCOUNTER — Ambulatory Visit: Payer: Medicare HMO | Admitting: Cardiology

## 2019-10-31 ENCOUNTER — Telehealth: Payer: Self-pay | Admitting: *Deleted

## 2019-10-31 NOTE — Telephone Encounter (Addendum)
I spoke with the pt's daughter Jo-Amber (on [place]) and discussed the results of the pt's MRI brain as noted below. She verbalized understanding and her questions were answered. Pt does have formal memory testing scheduled with Dr Kieth Brightly on 12/29/19. 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 The daughter also feels the pt should have the PET scan. She said lately the patient has gone downhill some and has been carrying a baby doll and is resistant to family correcting her and telling her its not real. I did recommend an evaluation by primary care for the recent change to be sure there is nothing medical (infections, etc) to explain the cognitive change. I let her know I would send a message to Dr Lucia Gaskins as well so she would be aware and could advise on the PET scan order. Jo-Amber verbalized appreciation.

## 2019-10-31 NOTE — Telephone Encounter (Signed)
-----   Message from Anson Fret, MD sent at 10/28/2019  8:16 AM EDT ----- Your brain has some findings that can be normal in aging. As we age we do have brain volume loss ("atrophy") and the "chronic small vessel ischemic disease" can also be seen in aging but can be also caused by high blood pressure, diabetes, high cholesterol, smoking, obesity and if severe can cause cognitive problems. No strokes or other concerning findings. At this time I recommend proceeding with formal memory testing or PET Scan as we discussed thanks. Dr Lucia Gaskins

## 2019-11-01 NOTE — Addendum Note (Signed)
Addended by: Naomie Dean B on: 11/01/2019 10:17 AM   Modules accepted: Orders

## 2019-11-01 NOTE — Telephone Encounter (Signed)
Ordered. thanks

## 2019-12-05 ENCOUNTER — Telehealth: Payer: Self-pay | Admitting: Neurology

## 2019-12-05 NOTE — Telephone Encounter (Signed)
Pt's daughter Letitia Neri called wanting to know the update on the MyChart message that was sent about the pt's DMV Paperwork.

## 2019-12-13 DIAGNOSIS — Z0279 Encounter for issue of other medical certificate: Secondary | ICD-10-CM

## 2019-12-15 ENCOUNTER — Telehealth: Payer: Self-pay | Admitting: Neurology

## 2019-12-15 ENCOUNTER — Telehealth: Payer: Self-pay | Admitting: *Deleted

## 2019-12-15 NOTE — Telephone Encounter (Signed)
Pt. Forms faxed to [facility].

## 2019-12-15 NOTE — Telephone Encounter (Signed)
Received Driver [employer] report (copy sent to medical records to be scanned). They recommend patient retire from driving. We also received DMV form from patient to complete.

## 2019-12-15 NOTE — Telephone Encounter (Signed)
DMV form completed, signed, and sent to medical records for processing.  

## 2019-12-29 ENCOUNTER — Encounter: Payer: Medicare HMO | Attending: Psychology | Admitting: Psychology

## 2019-12-29 ENCOUNTER — Other Ambulatory Visit: Payer: Self-pay

## 2019-12-29 ENCOUNTER — Encounter: Payer: Self-pay | Admitting: Psychology

## 2019-12-29 DIAGNOSIS — R4189 Other symptoms and signs involving cognitive functions and awareness: Secondary | ICD-10-CM | POA: Diagnosis present

## 2019-12-29 DIAGNOSIS — F0391 Unspecified dementia with behavioral disturbance: Secondary | ICD-10-CM | POA: Insufficient documentation

## 2019-12-29 DIAGNOSIS — R413 Other amnesia: Secondary | ICD-10-CM | POA: Diagnosis present

## 2019-12-29 NOTE — Progress Notes (Signed)
Neuropsychological Consultation   Patient:   Lisa Bird   DOB:   1933-04-25  MR Number:  [id number]  [facility]  [facility] [address] [id number] [facility] [place] [id number] Dept: [phone]           Date of Service:   12/29/2019  Start Time:   9 AM End Time:   11 AM  Today's visit consisted of a 1 hour and 15-minute face-to-face clinical interview that was conducted in my outpatient clinic office with the patient and her daughter present.  45 minutes were spent with records review, conceptualizing case, establishing plans for formal neuropsychological testing and report writing.  Provider/Observer:  Arley PhenixJohn Baleria Wyman, Psy.D.       Clinical Neuropsychologist       Billing Code/Service: 96116/96121  Chief Complaint:    Lisa RakersBetty C. Jeannine KittenHaskins [name] is an 86 year old female referred for neuropsychological evaluation by Lisa DeanAntonia Ahern, [facility] with [facility].  The patient was initially referred for neurological assessment due to memory loss and cognitive impairments by the patient's PCP Lisa BowlerJessica Livengood, PA-C.  The patient has a past medical history that includes orthostatic hypotension, mild cognitive impairments and unsteady gait and dizziness.  The patient's daughter is describing ongoing changes in memory and increasing cognitive difficulties that have been developing and persisting for some time now.  The patient has had falls and difficulties recalling people's names, places in relationships between various family members.  Word finding difficulties are noted.  The patient has also had times of delusional ideation that are very structured and firmly held believes.  The patient has a strong belief that people are coming in her house and moving objects around and later moving them back.  She reports that while she has never seen them she has heard them and  seeing things that have been moved.  Patient's daughter describes other situations where the patient will believe that she has 2 houses that are very similar to each other and that some of her neighbors also have multiple houses.  She has recently had her driving privileges [facility] and she has not been driving for several months now.  The patient is very frustrated and defensive at times and denies a significant level of cognitive changes that are reported by her daughter and she becomes very defensive and upset when feeling like her competency or police are questioned.  Reason for Service:  Lisa RakersBetty C. Jeannine KittenHaskins [name] is an 86 year old female referred for neuropsychological evaluation by Lisa DeanAntonia Ahern, MD [facility].  The patient was initially referred for neurological assessment due to memory loss and cognitive impairments by the patient's PCP Lisa BowlerJessica Livengood, PA-C.  The patient has a past medical history that includes orthostatic hypotension, mild cognitive impairments and unsteady gait and dizziness.  The patient's daughter is describing ongoing changes in memory and increasing cognitive difficulties that have been developing and persisting for some time now.  The patient has had falls and difficulties recalling people's names, places in relationships between various family members.  Word finding difficulties are noted.  The patient has also had times of delusional ideation that are very structured and firmly held believes.  The patient has a strong belief that people are coming in her house and moving objects around and later moving them back.  She reports that while she has never seen them she has heard them and  seeing things that have been moved.  Patient's daughter describes other situations where the patient will believe that she has 2 houses that are very similar to each other and that some of her neighbors also have multiple houses.  She has  recently had her driving privileges suspended due to medical reasons to the [facility] and she has not been driving for several months now.  The patient is very frustrated and defensive at times and denies a significant level of cognitive changes that are reported by her daughter and she becomes very defensive and upset when feeling like her competency or believes are questioned.  The patient is described as having declining cognitive functioning for some time now and her dizziness and fear of falling have been present for years now.  The patient's daughter reports that the patient will have increasing difficulties with memory and forgetting names of various people and relationships they have to each other.  The patient's daughter describes the delusions that the patient exhibits and that the patient will become very determined that she has 2 separate houses and that people are changing things around but when the patient shows these changes to the daughter it is after they have been placed back into their original spot by these other individuals that are of unknown pattern.  The patient has perceived that there are individuals living under her house that are the culprits for these manipulations of objects in her house.  The patient has had times of significant dizziness and episodes of dizziness when she gets up from a seated position and this is resulted in multiple falls and will feel like she is going to fall backwards.  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  The patient has hit the floor hard enough to cause her skin to split another lacerations on her head.  No concussions have been noted.  The patient reports that she has had these dizzy spells since she was first married dating back to the 64s.  The patient, and she is never told any 1 but the daughter denies ever seeing these types of dizzy spells in the past.  The patient is also described as having some changes in visual-spatial  processing and has been known to have difficulties with geographic orientation to time although they were denied by the patient.  The primary difficulty include remembering names and days of the week, difficulty remembering where she has put things, losing the concept of time with examples of calling people in the middle of the night, experiencing delusions of people coming into her house or moving her furniture to even the point of moving entire rooms.  She is believe they have been closing off rooms to her and that people were in her car at night etc.  The patient's memory, judgment and decision-making skills have all changed progressively over time.  The patient has significant sleep disturbance.  She rarely sleeps for more than a few hours at a time and when she does go to sleep she will wake up and wander in the house.  There has been significant appetite changes and the patient reports that she does not feel hungry and her daughter has to monitor that the patient is eating adequately.  Taste and smell are described to have changed.  There have been a lot of disagreements between the patient and her daughters with significant confrontation between them about issues especially what is happening in the patient's perceptions around her house and restrictions and driving.  The patient has participated in physical therapy this past summer to work on balance and mobility issues.  The patient and her daughter both deny extensive family history of dementia other cognitive difficulties.  The patient displays some mild tremor in her hand both at rest but exaggerated by movement.  The patient's aunt appears to have had essential tremor and another aunt had memory issues and dementia with aging.  No other family members were noted to have issues in the patient's mother maintain good cognition throughout life.  The patient has had a number of psychosocial stressors recently.  She had a lot of mood changes and stress  associated with her husband passed away and she recently moved closer to her family and away from her home town forcing to move away from all of her friends.  The patient is described as not being happy as she is alone most the time even though her daughters will see her often.  Increasing her isolation are recent Covid restrictions.  This makes it hard for her to make new friends.  She moved to [facility] last July.  The patient has had a PET scan ordered and apparently approved by insurance but has not been conducted as of yet.  The patient had an MRI conducted on 10/26/2019.  The impressions of this MRI included moderate generalized atrophy and moderate periventricular and subcortical and pontine chronic small vessel ischemic disease.  There were no indications of acute processes.  Ventricles were of normal size and appearance.  Reliability of Information: Information was derived from 1 hour 15-minute clinical interview with both the patient and her daughter.  Other sources of data include medical records review and a particular information from Dr. Trevor Mace initial assessment.  Behavioral Observation: Lisa Bird  presents as a 86 y.o.-year-old Right Caucasian Female who appeared her stated age. her dress was Appropriate and she was Well Groomed and her manners were Appropriate to the situation.  her participation was indicative of Appropriate and Redirectable behaviors.  There were not any physical disabilities noted.  she displayed an appropriate level of cooperation and motivation with the exception of times where the patient felt she was being challenged in her believes about what was happening in her house and display of significant frustrations around issues related to her no longer driving..     Interactions:    Active Appropriate and Redirectable  Attention:   abnormal and attention span appeared shorter than expected for age  Memory:   abnormal; remote memory intact, recent memory  impaired  Visuo-spatial:  While this is was not formally assessed today the patient does have reports and indications of changes in visual-spatial processing.  Speech (Volume):  normal  Speech:   normal; some word finding issues noted.  Thought Process:  Circumstantial and Tangential  Though Content:  Rumination; delusions  Orientation:   person, place, time/date and situation  Judgment:   Fair  Planning:   Poor  Affect:    Anxious and Irritable  Mood:    Dysphoric  Insight:   Fair  Intelligence:   normal  Marital Status/Living: The patient was born and raised in [place] along with 5 siblings.  The patient currently lives alone but has regular contact with her daughters who help take care of her.  The patient is lives alone for the past 3 to 3-1/2 years since her husband passed away.  She was married for 62 years and had no previous marriages.  The patient has  a 68 year old daughter and a 66 year old daughter in good health.  Current Employment: The patient is retired.  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  Hobbies and interests include taking daily walks, sewing in the past and drawing in the past as well as writing stories in the past.  Substance Use:  No concerns of substance abuse are reported.    Education:   The patient graduated high school performing very well in academic classes.  Extracurricular activities included participation in the beta club as well as playing on the basketball team.  Medical History:   Past Medical History:  Diagnosis Date  . Orthostatic hypotension     Psychiatric History:  The patient has no prior psychiatric history.  Family Med/Psych History:  Family History  Problem Relation Age of Onset  . Syncope episode Mother   . Stroke Mother   . Dementia Neg Hx    Impression/DX:  Lisa Bird) is an 86 year old female referred for neuropsychological  evaluation by Lisa Dean, MD with [facility].  The patient was initially referred for neurological assessment due to memory loss and cognitive impairments by the patient's PCP Lisa Bowler, PA-C.  The patient has a past medical history that includes orthostatic hypotension, mild cognitive impairments and unsteady gait and dizziness.  The patient's daughter is describing ongoing changes in memory and increasing cognitive difficulties that have been developing and persisting for some time now.  The patient has had falls and difficulties recalling people's names, places in relationships between various family members.  Word finding difficulties are noted.  The patient has also had times of delusional ideation that are very structured and firmly held believes.  The patient has a strong belief that people are coming in her house and moving objects around and later moving them back.  She reports that while she has never seen them she has heard them and seeing things that have been moved.  Patient's daughter describes other situations where the patient will believe that she has 2 houses that are very similar to each other and that some of her neighbors also have multiple houses.  She has recently had her driving privileges suspended due to medical reasons to the [facility] and she has not been driving for several months now.  The patient is very frustrated and defensive at times and denies a significant level of cognitive changes that are reported by her daughter and she becomes very defensive and upset when feeling like her competency or police are questioned.   Disposition/Plan:  We have set the patient up for formal neuropsychological testing and will administer a foundational battery of the Wechsler Adult Intelligence Scale to achieve a broad range of objective measures of cognitive functioning as well as the Wechsler Memory Scale's and expressive language scales.  Once this objective assessment  of cognition is completed a formal report will be written and provided to referring doctor as well as made available to the patient and her family with formal feedback session also scheduled.  Diagnosis:    Cognitive impairment  Memory loss  Dementia with behavioral disturbance, unspecified dementia type (HCC)         Electronically Signed   _______________________ Arley Phenix, Psy.D.

## 2020-01-24 ENCOUNTER — Telehealth: Payer: Self-pay | Admitting: Neurology

## 2020-01-24 NOTE — Telephone Encounter (Deleted)
DMV form faxed to [phone] on 01/24/2020.

## 2020-01-25 NOTE — Telephone Encounter (Signed)
[place] DMV fax line down, form mailed to DMV 01/25/2020. 

## 2020-01-26 NOTE — Telephone Encounter (Signed)
Called and spoke with patient's daughter Ericka Pontiff. She wanted to know what the PET scan was for.  I explained to her that when Dr. Lucia Gaskins saw the patient back on 09/26/19, she recommended a PET scan to see exactly what type of dementia the patient has and to develop a treatment plan. I reassured her that even that the test was not concerning for cancer. She verbalized understanding. She is worried about the treatment, especially if it involves medication as the patient refuses to take medication, only supplements. She is scheduled for formal memory testing tomorrow.   She stated that since the July 2021 appointment, the patient's memory has gotten worse. Just last night, she found the patient knocking on her door at midnight. She is more confused during the day as well as reporting hallucinations. Denies any new falls.   She wants to discuss this information with her sister and decide if the PET is really necessary at this point, but she also wants to know what Dr. Lucia Gaskins recommends.   Dr. Lucia Gaskins, can you please advise? Thanks!

## 2020-01-26 NOTE — Telephone Encounter (Signed)
I have talked to Patient's daughter and she would like a telephone call back and to discuss if they there mother really need's to do PET scan . Please call daughter at [phone]   Daughter's name in [place] . Let me know if she want's to have PET scan will have to re auth .

## 2020-01-27 ENCOUNTER — Encounter: Payer: Medicare HMO | Admitting: Psychology

## 2020-01-30 ENCOUNTER — Other Ambulatory Visit: Payer: Self-pay

## 2020-01-30 ENCOUNTER — Encounter: Payer: Self-pay | Admitting: Psychology

## 2020-01-30 ENCOUNTER — Encounter: Payer: Medicare HMO | Attending: Psychology | Admitting: Psychology

## 2020-01-30 DIAGNOSIS — F0281 Dementia in other diseases classified elsewhere with behavioral disturbance: Secondary | ICD-10-CM | POA: Diagnosis present

## 2020-01-30 DIAGNOSIS — G3183 Dementia with Lewy bodies: Secondary | ICD-10-CM | POA: Diagnosis present

## 2020-01-30 DIAGNOSIS — R4189 Other symptoms and signs involving cognitive functions and awareness: Secondary | ICD-10-CM | POA: Insufficient documentation

## 2020-01-30 NOTE — Progress Notes (Signed)
Neuropsychology Note  Lisa Bird completed 180  minutes of neuropsychological testing with this provider. She was highly self-critical about her performance (e.g., "you must think i'm the dumbest person in the word; I'm broken" and quick to get frustrated; threw pencil 2-3 times down on table when confronted with challenging item. She was inattentive overall and have trouble learning instructions; needed repetition and additional prompting. Mild-moderate word finding difficulty and word-subsituitions were observed. Additional language measures were not administered due to time constraints.      Composite Score Summary  Scale Sum of Scaled Scores Composite Score Percentile Rank 95% Conf. Interval Qualitative Description  Verbal Comprehension 16 VCI 74 4 69-81 Borderline  Perceptual Reasoning 21 PRI 82 12 77-89 Low Average  Working Memory 11 WMI 74 4 69-82 Borderline  Processing Speed 9 PSI 71 3 66-82 Borderline  Full Scale 57 FSIQ 71 3 68-76 Borderline  General Ability 37 GAI 76 5 72-82 Borderline   Verbal Comprehension Subtests Summary  Subtest Raw Score Scaled Score Percentile Rank Reference Group Scaled Score SEM  Similarities 12 6 9 4  0.90  Vocabulary 22 7 16 7  0.73  Information 2 3 1 3  0.73   Perceptual Reasoning Subtests Summary  Subtest Raw Score Scaled Score Percentile Rank Reference Group Scaled Score SEM  Block Design 12 5 5 3  1.34  Matrix Reasoning 7 8 25 3  1.12  Visual Puzzles 7 8 25 5  1.27     Working Raw Score Scaled Score Percentile Rank Reference Group Scaled Score SEM  Digit Span 13 4 2 2  0.85  Arithmetic 8 7 16 5  0.99   Processing Speed Subtests Summary  Subtest Raw Score Scaled Score Percentile Rank Reference Group Scaled Score SEM  Symbol Search 4 3 1 1  1.12  Coding 21 6 9 1  1.12   Note: Symbol Search raw score improved to 8 total  on re-readministration using page 3 of packet and with additional prompting  (e.g., challenge to go faster).    Working Memory Process Score Summary  Process Score Raw Score Scaled Score Percentile Rank Base Rate SEM  Digit Span Forward 6 6 9  -- 1.31  Digit Span Backward 6 8 25  -- 1.44  Digit Span Sequencing 1 3 1  -- 1.20  Longest Digit Span Forward 4 -- -- 99.0 --  Longest Digit Span Backward 3 -- -- 90.0 --  Longest Digit Span Sequence 3 -- -- 91.0 --    Index Score Summary  Index Sum of Scaled Scores Index Score Percentile Rank 95% Confidence Interval Qualitative Descriptor  Auditory Memory (AMI) 15 62 1 58-70 Extremely Low  Visual Memory (VMI) 11 76 5 72-82 Borderline  Immediate Memory (IMI) 12 63 1 59-71 Extremely Low  Delayed Memory (DMI) 14 67 1 62-77 Extremely Low    Primary Subtest Scaled Score Summary  Subtest Domain Raw Score Scaled Score Percentile Rank  Logical Memory I AM 10 4 2   Logical Memory II AM 4 6 9   Verbal Paired Associates I AM 2 3 1   Verbal Paired Associates II AM 0 2 0.4  Visual Reproduction I VM 14 5 5   Visual Reproduction II VM 3 6 9   Symbol Span VWM 8 7 16    Auditory Memory Process Score Summary  Process Score Raw Score Scaled Score Percentile Rank Cumulative Percentage (Base Rate)  LM II Recognition 15 - - 17-25%  VPA II Recognition 18 - - 3-9%   Visual Memory Process Score Summary  Process Score Raw Score Scaled Score Percentile Rank Cumulative Percentage (Base Rate)  VR II Recognition 4 - - 51-75%     ABILITY-MEMORY ANALYSIS  Ability Score:  GAI: 76 Date of Testing:  WAIS-IV; WMS-IV 2020/01/30  Predicted Difference Method   Index Predicted WMS-IV Index Score Actual WMS-IV Index Score Difference Critical Value  Significant Difference Y/N Base Rate  Auditory Memory 87 62 25 9.33 Y 2-3%  Visual Memory 86 76 10 7.72 Y 20-25%  Immediate Memory 84 63 21 10.41 Y 3-4%  Delayed Memory 86 67 19 10.86 Y 5-10%  Statistical significance (critical value) at the .01 level.   Contrast Scaled Scores  Score  Score 1 Score 2 Contrast Scaled Score  General Ability Index vs. Auditory Memory Index 76 62 4  General Ability Index vs. Visual Memory Index 76 76 8  General Ability Index vs. Immediate Memory Index 76 63 4  General Ability Index vs. Delayed Memory Index 76 67 5  Verbal Comprehension Index vs. Auditory Memory Index 74 62 4  Perceptual Reasoning Index vs. Visual Memory Index 82 76 7  Working Memory Index vs. Auditory Memory Index 74 62 4     Lisa Bird will return on 03/15/19 or sooner for an interactive feedback session with Dr. Kieth Brightly at which time her test performances, clinical impressions and treatment recommendations will be reviewed in detail. The patient understands she can contact our office should she require our assistance before this time.  Full report to follow.

## 2020-02-01 ENCOUNTER — Other Ambulatory Visit: Payer: Self-pay

## 2020-02-01 ENCOUNTER — Encounter (HOSPITAL_BASED_OUTPATIENT_CLINIC_OR_DEPARTMENT_OTHER): Payer: Medicare HMO | Admitting: Psychology

## 2020-02-01 ENCOUNTER — Encounter: Payer: Self-pay | Admitting: Psychology

## 2020-02-01 DIAGNOSIS — F02818 Dementia in other diseases classified elsewhere, unspecified severity, with other behavioral disturbance: Secondary | ICD-10-CM

## 2020-02-01 DIAGNOSIS — G3183 Dementia with Lewy bodies: Secondary | ICD-10-CM | POA: Diagnosis not present

## 2020-02-01 DIAGNOSIS — R4189 Other symptoms and signs involving cognitive functions and awareness: Secondary | ICD-10-CM | POA: Diagnosis not present

## 2020-02-01 DIAGNOSIS — F0281 Dementia in other diseases classified elsewhere with behavioral disturbance: Secondary | ICD-10-CM

## 2020-02-01 NOTE — Progress Notes (Addendum)
Neuropsychological Evaluation   Patient:  Lisa Bird [facility]   DOB: 09-15-1933  MR Number: [id number]  Location: [facility] FOR PAIN AND REHABILITATIVE MEDICINE [facility] PHYSICAL MEDICINE AND REHABILITATION [address] [place], [address] [id number] [facility] [place] [address] Dept: [phone]  Start: 9 AM End: 10 AM  Provider/Observer:     Hershal Coria PsyD  Chief Complaint:      Chief Complaint  Patient presents with  . Memory Loss  . Other    Dizziness and balance disturbance, strongly held delusional ideation, word finding difficulties and visual-spatial and geographic disorientation.  . Hallucinations    Reason For Service:     Emerlyn Mehlhoff. Helle Eber Jones) is an 86 year old female referred for neuropsychological evaluation by Naomie Dean, MD with [facility].  The patient was initially referred for neurological assessment due to memory loss and cognitive impairments by the patient's PCP Ernst Bowler, PA-C.  The patient has a past medical history that includes orthostatic hypotension, mild cognitive impairments and unsteady gait and dizziness.  The patient's daughter describes ongoing changes related to memory impairments and increasing cognitive difficulties that have been developing and persisting for some time now.  The patient has had falls and difficulties recalling people's names, places and relationships between various family members.  Word finding difficulties are noted.  The patient has also had times of delusional ideation that are very structured and firmly held believes.  The patient has a strong belief that people are coming in her house and moving objects around and later moving them back.   The patient reports that while she has never seen them she has heard them and seeing things that have been moved.  Patient's daughter describes other situations where the patient will believe that she has 2 houses that are very similar to each  other and that some of her neighbors also have multiple houses.  She has recently had her driving privileges suspended due to medical reasons by the [facility] and she has not been driving for several months now.  The patient is very frustrated and defensive at times and denies a significant level of cognitive changes that are reported by her daughter and she becomes very defensive and upset when feeling like her competency or or abilities are questioned.  The patient is described as having declining cognitive functioning for some time now and her dizziness and fear of falling have been present for years now.  The patient's daughter reports that the patient will have increasing difficulties with memory and forgetting names of various people and relationships they have to each other.  The patient's daughter describes the delusions that the patient exhibits and that the patient will become very determined that she has 2 separate houses and that people are changing things around but when the patient shows these changes to the daughter it is after they have been placed back into their original spot by these other individuals that are of unknown origin.  The patient has perceived that there are individuals living under her house that are the culprits for these manipulations of objects in her house.  The patient has had times of significant dizziness and episodes of dizziness when she gets up from a seated position and this has resulted in multiple falls and will feel like she is going to fall backwards.  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  The patient has hit the floor hard enough to cause her skin to split with lacerations on her  head.  No concussions have been noted.  The patient reports that she has had these dizzy spells since she was first married dating back to the 35s.  The patient reports that and she has never told anyone but the daughter denies ever seeing these types of dizzy spells in  the past.  The patient is also described as having some changes in visual-spatial processing and has been known to have difficulties with geographic orientation at times although they were denied by the patient.  The primary difficulty include remembering names and days of the week, difficulty remembering where she has put things, losing the concept of time with examples of calling people in the middle of the night, experiencing delusions of people coming into her house or moving her furniture to even the point of moving entire rooms.  She has believed they have been closing off rooms to her and that people were in her car at night etc.  The patient's memory, judgment and decision-making skills have all changed progressively over time.  The patient has significant sleep disturbance.  She rarely sleeps for more than a few hours at a time and when she does go to sleep she will wake up and wander around in the house.  There has been significant appetite changes and the patient reports that she does not feel hungry and her daughter has to monitor that the patient is eating adequately.  Taste and smell are described to have changed.  There have been a lot of disagreements between the patient and her daughters with significant confrontation between them about issues especially around what is happening within the house contrasting with what the patient's perceptions are about the events within the house along with restrictions and driving.  The patient has participated in physical therapy this past summer to work on balance and mobility issues.  The patient and her daughter both deny extensive family history of dementia other cognitive difficulties.  The patient displays some mild tremor in her hand both at rest but exaggerated by movement.  The patient's aunt appears to have had essential tremor and another aunt had memory issues and dementia with aging.  No other family members were noted to have issues in the  patient's mother maintain good cognition throughout life.  The patient has had a number of psychosocial stressors recently.  She had a lot of mood changes and stress associated with her husband passed away and she recently moved closer to her family and away from her home town forcing to move away from all of her friends.  The patient is described as not being happy as she is alone most the time even though her daughters will see her often.  Increasing her isolation are recent Covid restrictions.  This makes it hard for her to make new friends.  She moved to [place] [place] [facility] last July.  A recent note found in the patient's medical records between her daughter and the patient's neurologist highlight the family's concern about further progression in the patient's cognitive deficits and behavioral status.  The patient had traveled across the street to her daughter's house and banged on the front door reporting that "those people who come from behind the mirror" were in her house and when family member went to check they could not find anyone in the house.  There is great concerns about needing a safe place for their mother to be in great concerns about behavioral changes and delusional/visual hallucinatory types of symptoms.  The patient has  had a PET scan ordered and apparently approved by insurance but has not been conducted as of yet.  The patient had an MRI conducted on 10/26/2019.  The impressions of this MRI included moderate generalized atrophy and moderate periventricular and subcortical and pontine chronic small vessel ischemic disease.  There were no indications of acute processes.  Ventricles were of normal size and appearance.  Testing Administered:  The patient was administered a standardized objective measures of the Wechsler Adult Intelligence Scale-Reminyl 4 as well as the Wechsler Memory Scale-IV for older adults.  Behavioral Observation:  Jamillah Camilo Pujol completed 180  minutes of  neuropsychological testing with this provider. She was highly self-critical about her performance (e.g., "you must think i'm the dumbest person in the word; I'm broken" and quick to get frustrated; threw pencil 2-3 times down on table when confronted with challenging item. She was inattentive overall and have trouble learning instructions; needed repetition and additional prompting. Mild-moderate word finding difficulty and word-subsituitions were observed. Additional language measures were not administered due to time constraints.    Test Results:   Initially, an estimation was made as to historical/premorbid intellectual and cognitive functioning.  Taking into account the patient's past educational and occupational history and hobbies and interests along with psychosocial variables it is estimated that the patient likely performed in the average to high average range as a conservative estimate of premorbid intellectual and cognitive functioning.  We will utilize a standard index score between 100 and 110 as estimations of historical functioning on these current assessment variables and scaled score of 10 as a reference for conservative estimation of previous functioning levels.  Given the patient and her family's reports of progressive cognitive changes and memory disturbance the interpretation of the [place] adult intelligence scale IQ quotients should not be interpreted as reflecting historical intellectual capacities as there used here to derive objective standardized assessment of a wide range of cognitive functioning rather than an accurate assessment of the patient's intellectual abilities during her life.   Composite Score Summary   Scale Sum of Scaled Scores Composite Score Percentile Rank 95% Conf. Interval Qualitative Description  Verbal Comprehension 16 VCI 74 4 69-81 Borderline  Perceptual Reasoning 21 PRI 82 12 77-89 Low Average  Working Memory 11 WMI 74 4 69-82 Borderline  Processing  Speed 9 PSI 71 3 66-82 Borderline  Full Scale 57 FSIQ 71 3 68-76 Borderline  General Ability 37 GAI 76 5 72-82 Borderline   Initially, the patient was administered the Wechsler Adult Intelligence Scale-IV to obtain an objective standardized assessment of a broad range of intellectual and cognitive domains.  The patient produced a full-scale IQ score of 71 which falls at the 3rd percentile and is in the borderline range of intellectual and cognitive functioning relative to normative comparison groups.  We also assess the patient's general abilities index score which places less emphasis on variables most sensitive to change over time including information processing speed and working memory/auditory encoding.  The patient produced a general abilities index score of 76 which falls at the 5th percentile and is also in the borderline range relative to a normative population.  Please global performance indices in are significantly below normative expectations and predicted levels based on her education and occupational histories.          Verbal Comprehension Subtests Summary   Subtest Raw Score Scaled Score Percentile Rank Reference Group Scaled Score SEM  Similarities 12 6 9 4  0.90  Vocabulary 22 7 16  7  0.73  Information 2 3 1 3  0.73   The patient produced a verbal comprehension index score of 74 which falls at the 4th percentile and is in the borderline range relative to a normative population.  There was considerable subtest scatter noted.  The patient had significant difficulties in deficits with regard to her general fund of information and this does suggest that there is basic information knowledge that the patient is very likely to a possessed that has been lost or not available to the patient currently.  The patient showed mild to moderate deficits with regard to verbal reasoning and her vocabulary knowledge both of which are below what would be predicted based on premorbid variables.           Perceptual Reasoning Subtests Summary   Subtest Raw Score Scaled Score Percentile Rank Reference Group Scaled Score SEM  Block Design 12 5 5 3  1.34  Matrix Reasoning 7 8 25 3  1.12  Visual Puzzles 7 8 25 5  1.27   The patient produced a perceptual reasoning index score of 82 which falls at the 12 percentile and is in the low average range relative to a normative population.  The patient showed moderate deficits with regard to her visual analysis and organizational abilities and mild deficits with regard to visual reasoning and visual problem solving abilities           Working Raw Score Scaled Score Percentile Rank Reference Group Scaled Score SEM  Digit Span 13 4 2 2  0.85  Arithmetic 8 7 16 5  0.99   The patient produced a working memory index score of 74 which falls at the 4th percentile and is in the borderline range relative to a normative population.  Considerable subtest scatter was noted again.  The patient showed significant deficits with regard to her immediate auditory encoding and auditory registration.  On more demanding measures that also required it mathematical abilities and manipulation of encoded information the patient did better but was still in the impaired range and below her expected performance levels.         Processing Speed Subtests Summary   Subtest Raw Score Scaled Score Percentile Rank Reference Group Scaled Score SEM  Symbol Search 4 3 1 1  1.12  Coding 21 6 9 1  1.12   Note: Symbol Search raw score improved to 8 total  on re-readministration using page 3 of packet and with additional prompting (e.g., challenge to go faster).    The patient produced a processing speed index score of 71 which falls in the 3rd percentile relative to a normative population.  She was administered a second trial of the symbol search measure and encouraged to go faster and improved her score somewhat but continued to perform in the impaired range.   There were clear deficits with regard to information processing speed and overall speed of mental operations.           Index Score Summary   Index Sum of Scaled Scores Index Score Percentile Rank 95% Confidence Interval Qualitative Descriptor  Auditory Memory (AMI) 15 62 1 58-70 Extremely Low  Visual Memory (VMI) 11 76 5 72-82 Borderline  Immediate Memory (IMI) 12 63 1 59-71 Extremely Low  Delayed Memory (DMI) 14 67 1 62-77 Extremely Low   The patient was then administered the Wechsler Memory Scale for older adults.  The patient generally performed in the extremely low to borderline range relative to a normative population suggesting significant  memory and learning deficits.  Overall with greater deficits for auditory learning and memory.  Initial encoding and registration abilities showed mild to moderate deficits for both auditory and visual memory and learning with somewhat greater deficits identified for auditory learning and memory.  Breaking the patient's memory function is down between auditory versus visual memory the patient produced an auditory memory index score of 62 which falls at the 1st percentile and is in the extremely low range relative to a normative population.  While doing somewhat better on visual memory she still produced an impaired score with a visual memory index score of 76 following at the 5th percentile and in the borderline range relative to a normative population.  Both of these performances are significantly impaired.  Breaking the patient's memory function is down between immediate versus delayed memory the patient produced an immediate memory index score of 63 which falls at the 1st percentile and is extremely low range.  The patient did not show any significant improvement or ability to maintain this information after a period of delay and she produced a delayed memory index score of 67 which falls at the 1st percentile and is in the extremely low range.   When the patient was placed in recognition formats her performance did suggests some improvements with regard to cued recall indicating that some of the level of her memory deficits have to do with the impaired retrieval of information.  However, even in these cued recall formats her performance did not improve dramatically suggesting that overall she is having memory impairments related to poor encoding/registration of information, impaired storage and organization of information and deficits with regard to retrieval of information.         Primary Subtest Scaled Score Summary   Subtest Domain Raw Score Scaled Score Percentile Rank  Logical Memory I AM Logical Memory II AM Verbal Paired Associates I AM Verbal Paired Associates II AM 0 2 0.4  Visual Reproduction I VM Visual Reproduction II VM Symbol Span VWM Auditory Memory Process Score Summary   Process Score Raw Score Scaled Score Percentile Rank Cumulative Percentage (Base Rate)  LM II Recognition 15 - - 17-25%  VPA II Recognition 18 - - 3-9%          Visual Memory Process Score Summary   Process Score Raw Score Scaled Score Percentile Rank Cumulative Percentage (Base Rate)  VR II Recognition 4 - - 51-75%      ABILITY-MEMORY ANALYSIS  Ability Score:    GAI: 76 Date of Testing:           WAIS-IV; WMS-IV 2020/01/30           Predicted Difference Method   Index Predicted WMS-IV Index Score Actual WMS-IV Index Score Difference Critical Value  Significant Difference Y/N Base Rate  Auditory Memory 87 62 25 9.33 Y 2-3%  Visual Memory 86 76 10 7.72 Y 20-25%  Immediate Memory 84 63 21 10.41 Y 3-4%  Delayed Memory 86 67 19 10.86 Y 5-10%  Statistical significance (critical value) at the .01 level.    The patient's ability-memory analysis was also calculated.  The patient's general abilities index score was utilized to produce a predicted performance on various  memory indices in this memory battery.  These predicted scores were then compared against the actual achieved  scores for more conservative and accurate estimations of current memory functions.  In all memory domains the patient's actual achieved performance was significantly below predicted levels based on her general abilities index score.  This pattern suggest that memory deficits are significant and greater than overall global cognitive deficits and impairments.  Summary of Results:   The results of the current neuropsychological evaluation identifies significant neuropsychological deficits on a wide range of cognitive domains.  The patient shows significant impairments with regard to global cognitive functioning with specific significant deficits and loss of abilities related to loss of her general fund of information, deficits for visual analysis and organizational abilities, significant reduction in information processing speed and visual scanning/visual searching abilities and significant memory deficits.  These memory deficits were seen with mild to moderate visual and auditory encoding deficits, significant impairments for both auditory and visual storage and organization of newly learned information and deficits with regard to the ability to retrieve that information after period of delay.  She showed some mild improvements in cued/recognition formats but overall, the patient displayed significant memory deficits in each stage or aspect of visual and auditory learning and memory.  The patient also showed mild deficits with regard to verbal reasoning and problem solving and reductions in her likely premorbid/historical vocabulary knowledge.  Mild deficits were also noted with regard to visual reasoning and visual problem solving abilities.  Impression/Diagnosis:   Overall, the results of the current neuropsychological evaluation and objective cognitive testing in conjunction with subjective symptoms  described and identified by the patient's family and to some degree the patient herself as well as available medical records including neuro imaging, past medical history and laboratory findings are consistent with a pattern of significant cognitive decline and dementia.  The pattern of neuropsychological deficits, behavioral symptoms including visual hallucinations and delusions, neuro imaging studies and lab work would suggest that the most likely diagnostic consideration would be of Lewy body dementia with behavioral disturbance.  The patient is showing some subcortical/pontine microvascular ischemic changes and moderate cortical atrophy.  These findings are likely not sufficient to explain the level of cognitive and memory deficits noted.  Her pontine involvement may be playing a role in some of her balance and gait issues although positional orthostasis has been the primary consideration for the symptoms.  The patient is having increasing significant visual delusions and hallucinations and also identifying some tremors although there are several potential etiological factors for her tremors including a family history of diagnosis of essential tremor, pontine and subcortical small vessel disease etc.  Again, the patient is showing significant cognitive deficits as described in the summary of results section above and in some cases these are significant and profound deficits relative to predicted levels of her performance based on her prior education occupational histories.  As far as recommendations, it will be important for the patient to have greater supervision.  These cognitive and visual hallucinations/delusional symptoms are likely to continue to progress and worsen with time.  The patient has a number of psychosocial variables including the death of her husband and moving away from her home to live near her children resulting in loss of friendships, significant ongoing sleep disturbance and significant  disturbance in her sleep-wake cycle.  Normalizing her sleep-wake cycle and improving sleep hygiene may have some very beneficial effects in her overall anxiety and hallucinatory experiences.  I have no records of the patient taking any medication such as Seroquel or other medicines that may help normalize her sleep  pattern and help with some of the delusional/visual hallucination type symptoms as well.  I will leave the specific medication regimen to her neurologist but I do think it would be very helpful to look at potential psychotropic options for her mood disturbance, anxiety, significant sleep disturbance and sleep-wake cycle disturbance to aid in improving some of the symptoms that the patient is dealing with.  I will sit down with the patient and her family and review the results of this current neuropsychological evaluation and this evaluation will be available to her referring neurologist through EMR.  Diagnosis:     Lewy body dementia with behavioral disturbance (HCC)   Arley PhenixJohn Mailen Newborn, Psy.D. Neuropsychologist

## 2020-02-16 ENCOUNTER — Other Ambulatory Visit: Payer: Self-pay

## 2020-02-16 ENCOUNTER — Encounter: Payer: Medicare HMO | Attending: Psychology | Admitting: Psychology

## 2020-02-16 DIAGNOSIS — F0281 Dementia in other diseases classified elsewhere with behavioral disturbance: Secondary | ICD-10-CM

## 2020-02-16 DIAGNOSIS — G3183 Dementia with Lewy bodies: Secondary | ICD-10-CM

## 2020-02-16 DIAGNOSIS — R4189 Other symptoms and signs involving cognitive functions and awareness: Secondary | ICD-10-CM | POA: Diagnosis not present

## 2020-02-16 DIAGNOSIS — R413 Other amnesia: Secondary | ICD-10-CM | POA: Diagnosis not present

## 2020-02-16 DIAGNOSIS — F02818 Dementia in other diseases classified elsewhere, unspecified severity, with other behavioral disturbance: Secondary | ICD-10-CM

## 2020-02-24 ENCOUNTER — Encounter: Payer: Self-pay | Admitting: Psychology

## 2020-02-24 NOTE — Progress Notes (Signed)
02/15/2020: Today I provided feedback regarding the results of the recent neuropsychological evaluation.  This feedback was divided in an in person setting and was conducted in my outpatient clinic office with the patient, her daughter and myself present.  We reviewed the results of the recent neuropsychological evaluation along with my diagnostic considerations and treatment recommendations.  The patient initially was very resistant to considering the possibility that some of the experiences and delusional thinking were in fact delusional/visual hallucination types of events that had to do with medical etiology.  However, after spending a great deal of time explaining how these conclusions were developed etc. the patient began to be much more receptive and engaging and willing to talk about some of the adaptive and coping strategies we could begin to deploy to at least minimize some of the most significant results of this degenerative condition.  For convenience sake I will provide a copy of the summary and impressions from the complete neuropsychological evaluation.  This final complete report can be found in the patient's EMR dated 02/01/2020.      Summary of Results:                        The results of the current neuropsychological evaluation identifies significant neuropsychological deficits on a wide range of cognitive domains.  The patient shows significant impairments with regard to global cognitive functioning with specific significant deficits and loss of abilities related to loss of her general fund of information, deficits for visual analysis and organizational abilities, significant reduction in information processing speed and visual scanning/visual searching abilities and significant memory deficits.  These memory deficits were seen with mild to moderate visual and auditory encoding deficits, significant impairments for both auditory and visual storage and organization of newly learned  information and deficits with regard to the ability to retrieve that information after period of delay.  She showed some mild improvements in cued/recognition formats but overall, the patient displayed significant memory deficits in each stage or aspect of visual and auditory learning and memory.  The patient also showed mild deficits with regard to verbal reasoning and problem solving and reductions in her likely premorbid/historical vocabulary knowledge.  Mild deficits were also noted with regard to visual reasoning and visual problem solving abilities.  Impression/Diagnosis:                     Overall, the results of the current neuropsychological evaluation and objective cognitive testing in conjunction with subjective symptoms described and identified by the patient's family and to some degree the patient herself as well as available medical records including neuro imaging, past medical history and laboratory findings are consistent with a pattern of significant cognitive decline and dementia.  The pattern of neuropsychological deficits, behavioral symptoms including visual hallucinations and delusions, neuro imaging studies and lab work would suggest that the most likely diagnostic consideration would be of Lewy body dementia with behavioral disturbance.  The patient is showing some subcortical/pontine microvascular ischemic changes and moderate cortical atrophy.  These findings are likely not sufficient to explain the level of cognitive and memory deficits noted.  Her pontine involvement may be playing a role in some of her balance and gait issues although positional orthostasis has been the primary consideration for the symptoms.  The patient is having increasing significant visual delusions and hallucinations and also identifying some tremors although there are several potential etiological factors for her tremors including a family history of diagnosis  of essential tremor, pontine and subcortical small  vessel disease etc.  Again, the patient is showing significant cognitive deficits as described in the summary of results section above and in some cases these are significant and profound deficits relative to predicted levels of her performance based on her prior education occupational histories.  As far as recommendations, it will be important for the patient to have greater supervision.  These cognitive and visual hallucinations/delusional symptoms are likely to continue to progress and worsen with time.  The patient has a number of psychosocial variables including the death of her husband and moving away from her home to live near her children resulting in loss of friendships, significant ongoing sleep disturbance and significant disturbance in her sleep-wake cycle.  Normalizing her sleep-wake cycle and improving sleep hygiene may have some very beneficial effects in her overall anxiety and hallucinatory experiences.  I have no records of the patient taking any medication such as Seroquel or other medicines that may help normalize her sleep pattern and help with some of the delusional/visual hallucination type symptoms as well.  I will leave the specific medication regimen to her neurologist but I do think it would be very helpful to look at potential psychotropic options for her mood disturbance, anxiety, significant sleep disturbance and sleep-wake cycle disturbance to aid in improving some of the symptoms that the patient is dealing with.  I will sit down with the patient and her family and review the results of this current neuropsychological evaluation and this evaluation will be available to her referring neurologist through EMR.  Diagnosis:                                Lewy body dementia with behavioral disturbance (HCC)   Arley Phenix, Psy.D. Neuropsychologist

## 2020-02-29 ENCOUNTER — Telehealth: Payer: Self-pay | Admitting: Neurology

## 2020-02-29 ENCOUNTER — Ambulatory Visit: Payer: Medicare HMO | Admitting: Psychology

## 2020-02-29 NOTE — Telephone Encounter (Signed)
Yes, it looks like she has had the workup and also already met with Dr. Sima Matas to review the results (please verify). She can follow up with me, not urgent but Jan or Feb would be good thanks

## 2020-02-29 NOTE — Telephone Encounter (Signed)
Pt's daughter, Obie Dredge (on [place]) called, ask Dr. Lucia Gaskins if she needs a follow up appt after seeing Dr. Marvetta Gibbons office. Would like a call from the office.

## 2020-02-29 NOTE — Telephone Encounter (Signed)
Spoke with pt's daughter and scheduled pt for Tues 2/1 at 8:30 AM arrival 8:00. She verbalized appreciation.

## 2020-03-07 NOTE — Telephone Encounter (Signed)
POA is aware Dr. Lucia Gaskins is out of the office til Monday and was okay waiting for a letter til then.  She just wanted to make sure Dr. Lucia Gaskins could put that patient didn't need to bring a bed with her as she normally sleeps on the sofa.

## 2020-03-14 ENCOUNTER — Ambulatory Visit: Payer: Medicare HMO | Admitting: Psychology

## 2020-04-03 ENCOUNTER — Telehealth: Payer: Self-pay | Admitting: Neurology

## 2020-04-03 NOTE — Telephone Encounter (Signed)
FYI: Pt's daughter, Audelia Acton (on [place]) called,  She has been placed in [facility]. Because she does not want to be there, facility recommended not taking her out the facility for 3 months. I have cancelled her appt on 04/10/20.

## 2020-04-03 NOTE — Telephone Encounter (Signed)
Noted thanks °

## 2020-04-10 ENCOUNTER — Ambulatory Visit: Payer: Medicare HMO | Admitting: Neurology

## 2020-11-24 ENCOUNTER — Emergency Department (HOSPITAL_COMMUNITY): Payer: Medicare HMO

## 2020-11-24 ENCOUNTER — Emergency Department (HOSPITAL_COMMUNITY)
Admission: EM | Admit: 2020-11-24 | Discharge: 2020-11-24 | Disposition: A | Discharge status: Home / Self Care | Payer: Medicare HMO | Attending: Emergency Medicine | Admitting: Emergency Medicine

## 2020-11-24 ENCOUNTER — Other Ambulatory Visit: Payer: Self-pay

## 2020-11-24 DIAGNOSIS — S0081XA Abrasion of other part of head, initial encounter: Secondary | ICD-10-CM

## 2020-11-24 DIAGNOSIS — W19XXXA Unspecified fall, initial encounter: Secondary | ICD-10-CM | POA: Insufficient documentation

## 2020-11-24 DIAGNOSIS — F0390 Unspecified dementia without behavioral disturbance: Secondary | ICD-10-CM | POA: Diagnosis not present

## 2020-11-24 DIAGNOSIS — R600 Localized edema: Secondary | ICD-10-CM | POA: Insufficient documentation

## 2020-11-24 DIAGNOSIS — S0181XA Laceration without foreign body of other part of head, initial encounter: Secondary | ICD-10-CM | POA: Diagnosis not present

## 2020-11-24 DIAGNOSIS — S0990XA Unspecified injury of head, initial encounter: Secondary | ICD-10-CM | POA: Diagnosis present

## 2020-11-24 DIAGNOSIS — Y9289 Other specified places as the place of occurrence of the external cause: Secondary | ICD-10-CM | POA: Diagnosis not present

## 2020-11-24 LAB — URINALYSIS, ROUTINE W REFLEX MICROSCOPIC
Bilirubin Urine: NEGATIVE
Glucose, UA: NEGATIVE mg/dL
Ketones, ur: NEGATIVE mg/dL
Leukocytes,Ua: NEGATIVE
Nitrite: NEGATIVE
Protein, ur: NEGATIVE mg/dL
Specific Gravity, Urine: 1.01 (ref 1.005–1.030)
pH: 7.5 (ref 5.0–8.0)

## 2020-11-24 LAB — COMPREHENSIVE METABOLIC PANEL
ALT: 17 U/L (ref 0–44)
AST: 24 U/L (ref 15–41)
Albumin: 3.4 g/dL — ABNORMAL LOW (ref 3.5–5.0)
Alkaline Phosphatase: 83 U/L (ref 38–126)
Anion gap: 6 (ref 5–15)
BUN: 24 mg/dL — ABNORMAL HIGH (ref 8–23)
CO2: 25 mmol/L (ref 22–32)
Calcium: 8.9 mg/dL (ref 8.9–10.3)
Chloride: 107 mmol/L (ref 98–111)
Creatinine, Ser: 0.8 mg/dL (ref 0.44–1.00)
GFR, Estimated: >60 mL/min (ref >60)
Glucose, Bld: 87 mg/dL (ref 70–99)
Potassium: 4.5 mmol/L (ref 3.5–5.1)
Sodium: 138 mmol/L (ref 135–145)
Total Bilirubin: 0.6 mg/dL (ref 0.3–1.2)
Total Protein: 6.6 g/dL (ref 6.5–8.1)

## 2020-11-24 LAB — CBC WITH DIFFERENTIAL/PLATELET
Abs Immature Granulocytes: 0.03 10*3/uL (ref 0.00–0.07)
Basophils Absolute: 0.1 10*3/uL (ref 0.0–0.1)
Basophils Relative: 1 %
Eosinophils Absolute: 0.1 10*3/uL (ref 0.0–0.5)
Eosinophils Relative: 1 %
HCT: 35.7 % — ABNORMAL LOW (ref 36.0–46.0)
Hemoglobin: 11.6 g/dL — ABNORMAL LOW (ref 12.0–15.0)
Immature Granulocytes: 1 %
Lymphocytes Relative: 16 %
Lymphs Abs: 1 10*3/uL (ref 0.7–4.0)
MCH: 33.5 pg (ref 26.0–34.0)
MCHC: 32.5 g/dL (ref 30.0–36.0)
MCV: 103.2 fL — ABNORMAL HIGH (ref 80.0–100.0)
Monocytes Absolute: 0.6 10*3/uL (ref 0.1–1.0)
Monocytes Relative: 9 %
Neutro Abs: 4.6 10*3/uL (ref 1.7–7.7)
Neutrophils Relative %: 72 %
Platelets: 215 10*3/uL (ref 150–400)
RBC: 3.46 MIL/uL — ABNORMAL LOW (ref 3.87–5.11)
RDW: 13.5 % (ref 11.5–15.5)
WBC: 6.3 10*3/uL (ref 4.0–10.5)
nRBC: 0 % (ref 0.0–0.2)

## 2020-11-24 LAB — URINALYSIS, MICROSCOPIC (REFLEX)
Bacteria, UA: NONE SEEN
Squamous Epithelial / HPF: NONE SEEN (ref 0–5)
WBC, UA: NONE SEEN WBC/hpf (ref 0–5)

## 2020-11-24 MED ORDER — ACETAMINOPHEN 325 MG PO TABS
650.0000 mg | ORAL_TABLET | Freq: Once | ORAL | Status: AC
  Administered 2020-11-24: 650 mg via ORAL
  Filled 2020-11-24: qty 2

## 2020-11-24 NOTE — Discharge Instructions (Addendum)
Continue taking home occasions as prescribed. Take Tylenol as needed for pain. Wash the wound on your head twice a day with soap and water.  Otherwise keep covered with a nonstick dressing. Follow-up with her primary care doctor for recheck of symptoms. Return to the emergency room with any new, worsening, concerning symptoms

## 2020-11-24 NOTE — ED Notes (Signed)
Pt transported to xray 

## 2020-11-24 NOTE — ED Provider Notes (Signed)
Patient was signed out to me by previous provider at 3:41 PM. Please see their note for entirety of HPI  Plan at signout: - Ambulate - Follow up UA   Physical Exam  BP 126/68   Pulse 65   Temp 97.7 F (36.5 C) (Oral)   Resp 18   Ht 5\' 3"  (1.6 m)   Wt 44.9 kg   SpO2 97%   BMI 17.53 kg/m   Physical Exam Vitals and nursing note reviewed.  Constitutional:      General: She is not in acute distress.    Appearance: She is well-developed.  HENT:     Head: Normocephalic and atraumatic.  Eyes:     Conjunctiva/sclera: Conjunctivae normal.  Cardiovascular:     Rate and Rhythm: Normal rate and regular rhythm.     Heart sounds: No murmur heard. Pulmonary:     Effort: Pulmonary effort is normal. No respiratory distress.     Breath sounds: Normal breath sounds.  Abdominal:     Palpations: Abdomen is soft.     Tenderness: There is no abdominal tenderness.  Musculoskeletal:     Cervical back: Neck supple.  Skin:    General: Skin is warm and dry.     Comments: Skin tear to forehead.  No laceration.  Neurological:     General: No focal deficit present.     Mental Status: She is alert. Mental status is at baseline. She is disoriented.    ED Course/Procedures     Procedures  MDM  Urinalysis without evidence of UTI or nephrolithiasis.  Upon reassessment, patient remains disoriented, which is consistent with her baseline will evaluate dementia.  Daughter at bedside confirms that she has not deviated from her normal mental status.  Forehead wound was reexamined and redressed with bacitracin and sterile dressing.  They are comfortable with discharge home at this time.  Patient currently lives at a nursing facility memory unit.  Family will arrange for transportation home.       , MD 11/24/20 11/26/20    Manon Hilding, MD 11/24/20 [id number]

## 2020-11-24 NOTE — ED Triage Notes (Signed)
Pt bib ems from [facility]; unwitnessed fall, found supine on floor by staff; c/o neck, back, and bilateral leg pain; small abrasion over L eye; not on thinners; hx dementia; pt at baseline per staff, does not answer questions appropriately; ambulatory at baseline  110/70 99% RA HR 88

## 2020-11-24 NOTE — ED Provider Notes (Signed)
[facility] EMERGENCY DEPARTMENT Provider Note   CSN: [id number] Arrival date & time: 11/24/20  1114     History No chief complaint on file.   Lisa Bird is a 87 y.o. female presenting for evaluation after a fall.  Level 5 caveat due to dementia.  Patient states she fell, cannot tell me why this happened.  She cannot tell me when this happened.  She reports pain in her head, denies pain elsewhere.  Additional history obtained from EMS.  Per EMS, patient had an unwitnessed fall.  Per EMS patient was found supine on the floor with a pillow behind her head.  At the time, patient was complaining of neck, back, and bilateral leg pain.  She is not on blood thinners.  Per staff at the facility, patient is at baseline mental status.  HPI     Past Medical History:  Diagnosis Date   Orthostatic hypotension     Patient Active Problem List   Diagnosis Date Noted   Cognitive impairment 09/26/2019   Orthostatic hypotension 09/18/2019   Educated about COVID-19 virus infection 09/18/2019    Past Surgical History:  Procedure Laterality Date   NO PAST SURGERIES       OB History   No obstetric history on file.     Family History  Problem Relation Age of Onset   Syncope episode Mother    Stroke Mother    Dementia Neg Hx     Social History   Tobacco Use   Smoking status: Never   Smokeless tobacco: Never    Home Medications Prior to Admission medications   Medication Sig Start Date End Date Taking? Authorizing Provider  GNP 8 HOUR ARTHRITIS RELIEF 650 MG CR tablet Take 650 mg by mouth every 8 (eight) hours as needed. 09/26/20   [provider]  haloperidol (HALDOL) 0.5 MG tablet Take 0.5 mg by mouth 2 (two) times daily. 11/22/20   [provider]  valproic acid (DEPAKENE) 250 MG/5ML solution Take 125 mg by mouth 2 (two) times daily. 11/19/20   [provider]    Allergies    Patient has no allergy information on  record.  Review of Systems   Review of Systems  Unable to perform ROS: Dementia  Neurological:  Positive for headaches.   Physical Exam Updated Vital Signs BP (!) 147/67   Pulse 84   Temp 97.7 F (36.5 C) (Oral)   Resp 14   Ht 5\' 3"  (1.6 m)   Wt 44.9 kg   SpO2 100%   BMI 17.53 kg/m   Physical Exam Vitals and nursing note reviewed.  Constitutional:      General: She is not in acute distress.    Appearance: Normal appearance.  HENT:     Head: Normocephalic.      Comments: Skin tear of the right forehead.  Abrasion over the nose.  No injury noted elsewhere to the face. No hematoma or injury noted to the head or occiput. Eyes:     Conjunctiva/sclera: Conjunctivae normal.     Pupils: Pupils are equal, round, and reactive to light.  Neck:     Comments: No ttp Cardiovascular:     Rate and Rhythm: Normal rate and regular rhythm.     Pulses: Normal pulses.  Pulmonary:     Effort: Pulmonary effort is normal. No respiratory distress.     Breath sounds: Normal breath sounds. No wheezing.     Comments: Speaking in full sentences.  Clear  lung sounds in all fields. Abdominal:     General: There is no distension.     Palpations: Abdomen is soft. There is no mass.     Tenderness: There is no abdominal tenderness. There is no guarding or rebound.  Musculoskeletal:        General: Normal range of motion.     Right lower leg: Edema present.     Left lower leg: Edema present.     Comments: Pitting edema bilaterally. Chronic skin changes   Skin:    General: Skin is warm and dry.     Capillary Refill: Capillary refill takes less than 2 seconds.  Neurological:     Mental Status: She is alert. Mental status is at baseline.  Psychiatric:        Mood and Affect: Mood and affect normal.        Speech: Speech normal.        Behavior: Behavior normal.    ED Results / Procedures / Treatments   Labs (all labs ordered are listed, but only abnormal results are displayed) Labs Reviewed   CBC WITH DIFFERENTIAL/PLATELET  COMPREHENSIVE METABOLIC PANEL  URINALYSIS, ROUTINE W REFLEX MICROSCOPIC    EKG None  Radiology No results found.  Procedures Procedures   Medications Ordered in ED Medications  acetaminophen (TYLENOL) tablet 650 mg (650 mg Oral Given 11/24/20 1139)    ED Course  I have reviewed the triage vital signs and the nursing notes.  Pertinent labs & imaging results that were available during my care of the patient were reviewed by me and considered in my medical decision making (see chart for details).    MDM Rules/Calculators/A&P                           Pt presenting for evaluation after a fall. On exam, pt appears nontoxic. Has skin tear to forehead and abrasion of nasal bridge. Will order labs, UA, and ekg due to unwitnessed fall. Will order ct head, neck, and xray of chest and pelvis to look for injury.   CT head and neck negative for acute findings.  X-rays of the chest and pelvis viewed and independently interpreted by me, no fracture or dislocation.  Labs overall reassuring.  EKG nonischemic.  Urine is pending.  Discussed findings and plan with patient and daughter.  Discussed that once urine results, patient will likely be able to be discharged.  Pt signed out to Morrie Sheldon for f/u on UA and ambulation.   Final Clinical Impression(s) / ED Diagnoses Final diagnoses:  None    Rx / DC Orders ED Discharge Orders     None        Alveria Apley, PA-C 11/24/20 1531    Linwood Dibbles, MD 11/25/20 1006

## 2020-12-13 ENCOUNTER — Emergency Department (HOSPITAL_BASED_OUTPATIENT_CLINIC_OR_DEPARTMENT_OTHER)
Admission: EM | Admit: 2020-12-13 | Discharge: 2020-12-13 | Disposition: A | Discharge status: Home / Self Care | Payer: Medicare HMO | Attending: Emergency Medicine | Admitting: Emergency Medicine

## 2020-12-13 ENCOUNTER — Other Ambulatory Visit: Payer: Self-pay

## 2020-12-13 ENCOUNTER — Emergency Department (HOSPITAL_BASED_OUTPATIENT_CLINIC_OR_DEPARTMENT_OTHER): Payer: Medicare HMO

## 2020-12-13 DIAGNOSIS — R2243 Localized swelling, mass and lump, lower limb, bilateral: Secondary | ICD-10-CM | POA: Diagnosis present

## 2020-12-13 DIAGNOSIS — I878 Other specified disorders of veins: Secondary | ICD-10-CM | POA: Diagnosis not present

## 2020-12-13 LAB — CBC WITH DIFFERENTIAL/PLATELET
Abs Immature Granulocytes: 0.03 10*3/uL (ref 0.00–0.07)
Basophils Absolute: 0.1 10*3/uL (ref 0.0–0.1)
Basophils Relative: 1 %
Eosinophils Absolute: 0.1 10*3/uL (ref 0.0–0.5)
Eosinophils Relative: 1 %
HCT: 30.7 % — ABNORMAL LOW (ref 36.0–46.0)
Hemoglobin: 10.4 g/dL — ABNORMAL LOW (ref 12.0–15.0)
Immature Granulocytes: 0 %
Lymphocytes Relative: 16 %
Lymphs Abs: 1.2 10*3/uL (ref 0.7–4.0)
MCH: 33.9 pg (ref 26.0–34.0)
MCHC: 33.9 g/dL (ref 30.0–36.0)
MCV: 100 fL (ref 80.0–100.0)
Monocytes Absolute: 1 10*3/uL (ref 0.1–1.0)
Monocytes Relative: 14 %
Neutro Abs: 5.1 10*3/uL (ref 1.7–7.7)
Neutrophils Relative %: 68 %
Platelets: 243 10*3/uL (ref 150–400)
RBC: 3.07 MIL/uL — ABNORMAL LOW (ref 3.87–5.11)
RDW: 13.8 % (ref 11.5–15.5)
WBC: 7.5 10*3/uL (ref 4.0–10.5)
nRBC: 0 % (ref 0.0–0.2)

## 2020-12-13 LAB — BASIC METABOLIC PANEL
Anion gap: 9 (ref 5–15)
BUN: 27 mg/dL — ABNORMAL HIGH (ref 8–23)
CO2: 25 mmol/L (ref 22–32)
Calcium: 8.9 mg/dL (ref 8.9–10.3)
Chloride: 106 mmol/L (ref 98–111)
Creatinine, Ser: 0.78 mg/dL (ref 0.44–1.00)
GFR, Estimated: >60 mL/min (ref >60)
Glucose, Bld: 102 mg/dL — ABNORMAL HIGH (ref 70–99)
Potassium: 4.1 mmol/L (ref 3.5–5.1)
Sodium: 140 mmol/L (ref 135–145)

## 2020-12-13 NOTE — ED Notes (Signed)
Xeroform and ace bandages placed on Pt. Dr. Tanna Savoy checked fnal wrap before discharge

## 2020-12-13 NOTE — ED Provider Notes (Signed)
[facility] EMERGENCY DEPT Provider Note   CSN: [id number] Arrival date & time: 12/13/20  1542     History Chief Complaint  Patient presents with   Cellulitis    Jeremy [place] is a 87 y.o. female.  Patient is an 87 year old female with a history of dementia and frequent falls who is presenting today from her SNF facility because they reported that she is refusing to take her medications for cellulitis.  Patient is not having any complaints at this time.  She denies any pain in her legs.  Unclear if patient has been running fever or what specific antibiotic she was supposed to be taking.  Spoke with patient's daughter Obie Dredge and she reports the wound has been on her left lower extremity for about 6 weeks.  She has been on antibiotics but she was not aware of her not taking them.  She is not aware of her running fever and reports her legs have been swollen for a long time.  She does have compression socks and wears them most of the time but patient does not have them today.  The history is provided by the patient, the EMS personnel and medical records. The history is limited by the absence of a caregiver.      Past Medical History:  Diagnosis Date   Orthostatic hypotension     Patient Active Problem List   Diagnosis Date Noted   Cognitive impairment 09/26/2019   Orthostatic hypotension 09/18/2019   Educated about COVID-19 virus infection 09/18/2019    Past Surgical History:  Procedure Laterality Date   NO PAST SURGERIES       OB History   No obstetric history on file.     Family History  Problem Relation Age of Onset   Syncope episode Mother    Stroke Mother    Dementia Neg Hx     Social History   Tobacco Use   Smoking status: Never   Smokeless tobacco: Never    Home Medications Prior to Admission medications   Medication Sig Start Date End Date Taking? Authorizing Provider  furosemide (LASIX) 10 MG/ML solution Take 20 mg by  mouth daily as needed for edema.    [provider]  GNP 8 HOUR ARTHRITIS RELIEF 650 MG CR tablet Take 650 mg by mouth every 8 (eight) hours as needed. 09/26/20   [provider]  haloperidol (HALDOL) 0.5 MG tablet Take 0.5 mg by mouth 2 (two) times daily as needed for agitation (or paranoia). 11/22/20   [provider]  valproic acid (DEPAKENE) 250 MG/5ML solution Take 125 mg by mouth 2 (two) times daily. 11/19/20   [provider]    Allergies    Patient has no known allergies.  Review of Systems   Review of Systems  Unable to perform ROS: Dementia   Physical Exam Updated Vital Signs BP (!) 119/57 (BP Location: Right Arm)   Pulse 82   Resp 16   Ht 5\' 6"  (1.676 m)   Wt 54.4 kg   SpO2 98%   BMI 19.37 kg/m   Physical Exam Vitals and nursing note reviewed.  Constitutional:      General: She is not in acute distress.    Appearance: Normal appearance. She is well-developed.  HENT:     Head: Normocephalic and atraumatic.  Eyes:     Conjunctiva/sclera: Conjunctivae normal.     Pupils: Pupils are equal, round, and reactive to light.  Cardiovascular:  Rate and Rhythm: Normal rate and regular rhythm.     Heart sounds: No murmur heard. Pulmonary:     Effort: Pulmonary effort is normal. No respiratory distress.     Breath sounds: Normal breath sounds. No wheezing or rales.  Abdominal:     General: There is no distension.     Palpations: Abdomen is soft.     Tenderness: There is no abdominal tenderness. There is no guarding or rebound.  Musculoskeletal:        General: No tenderness. Normal range of motion.     Cervical back: Normal range of motion and neck supple.     Right lower leg: Edema present.     Left lower leg: Edema present.     Comments: Bilateral lower leg pitting edema with skin changes consistent with chronic venous stasis in bilateral lower legs.  Left lower leg does appear more swollen and slightly more red and warm.  2 wounds  present over the mid tib-fib without any drainage.  1+ DP pulse present bilaterally.  Skin:    General: Skin is warm and dry.     Findings: No erythema or rash.  Neurological:     Mental Status: She is alert. Mental status is at baseline.     Comments: Oriented to person but otherwise confused  Psychiatric:        Behavior: Behavior normal.     Comments: Calm and cooperative    ED Results / Procedures / Treatments   Labs (all labs ordered are listed, but only abnormal results are displayed) Labs Reviewed  CBC WITH DIFFERENTIAL/PLATELET - Abnormal; Notable for the following components:      Result Value   RBC 3.07 (*)    Hemoglobin 10.4 (*)    HCT 30.7 (*)    All other components within normal limits  BASIC METABOLIC PANEL - Abnormal; Notable for the following components:   Glucose, Bld 102 (*)    BUN 27 (*)    All other components within normal limits    EKG None  Radiology US Venous Img Lower  Left (DVT Study)  Result Date: 12/13/2020 CLINICAL DATA:  Left leg swelling and redness, cellulitis EXAM: LEFT LOWER EXTREMITY VENOUS DOPPLER ULTRASOUND TECHNIQUE: Gray-scale sonography with compression, as well as color and duplex ultrasound, were performed to evaluate the deep venous system(s) from the level of the common femoral vein through the popliteal and proximal calf veins. COMPARISON:  None. FINDINGS: VENOUS Normal compressibility of the common femoral, superficial femoral, and popliteal veins, as well as the visualized calf veins. Visualized portions of profunda femoral vein and great saphenous vein unremarkable. No filling defects to suggest DVT on grayscale or color Doppler imaging. Doppler waveforms show normal direction of venous flow, normal respiratory plasticity and response to augmentation. Limited views of the contralateral common femoral vein are unremarkable. OTHER None. Limitations: none IMPRESSION: Negative. Electronically Signed   By: Helyn Numbers M.D.   On:  12/13/2020 17:02    Procedures Procedures   Medications Ordered in ED Medications - No data to display  ED Course  I have reviewed the triage vital signs and the nursing notes.  Pertinent labs & imaging results that were available during my care of the patient were reviewed by me and considered in my medical decision making (see chart for details).    MDM Rules/Calculators/A&P  Patient is a well-appearing 87 year old female who was sent today from her skilled facility due to concern for cellulitis in her left lower leg.  Also she was refusing to take her antibiotic.  Patient is well-appearing here.  She denies any pain in her leg however there are changes consistent with chronic venous stasis in bilateral lower extremities but left is greater than right and left is slightly more warm and 2 wounds present on the left leg.  We will do an ultrasound to ensure no DVT.  Pulses are intact and low suspicion for arterial insufficiency at this time.  Labs to ensure no severely elevated white count are pending.  Suspect a lot of the issue may be due to chronic venous stasis.  This may not be cellulitis.  Patient does need compression socks and wound care.  No abscesses or signs of necrotizing fasciitis or septic joint at this time.  6:42 PM Patient's labs are reassuring and ultrasound is negative for DVT.  Discussed with her daughter getting medical grade compression socks and following up with wound care.  Low suspicion for cellulitis at this time.  MDM   Amount and/or Complexity of Data Reviewed Clinical lab tests: ordered and reviewed Tests in the radiology section of CPT: ordered and reviewed Independent visualization of images, tracings, or specimens: yes     Final Clinical Impression(s) / ED Diagnoses Final diagnoses:  Venous stasis of both lower extremities    Rx / DC Orders ED Discharge Orders     None        Gwyneth Sprout, MD 12/13/20  1843

## 2020-12-13 NOTE — Discharge Instructions (Addendum)
They sell compression stockings at the discount [facility] on [address].  Call then tomorrow and they can assist you with getting her some compression socks.  There is no evidence of blood clot today and low suspicion that this is infection and is most likely related to poor circulation with the veins.  It would be very important for her to follow-up with wound care to ensure the wounds will heal and compression socks should help as well as elevating the legs as often as possible

## 2020-12-13 NOTE — ED Notes (Signed)
Ultrasound at bedside

## 2020-12-13 NOTE — ED Notes (Signed)
  Patient came from dementia unit at [facility].  Patients daughter states she was taking the patient back to facility and getting her settled.

## 2020-12-13 NOTE — ED Triage Notes (Signed)
Pt. arrived via [facility] from [facility]. EMS stated that the PCP at the facility stated that the Pt. has not been taking her medication for cellulitis.  Pt does not complain of leg pain.   Pt's Vitals from EMS 148/80, Hr80, 99% Resp 18.

## 2021-01-29 ENCOUNTER — Encounter (HOSPITAL_BASED_OUTPATIENT_CLINIC_OR_DEPARTMENT_OTHER): Payer: Medicare HMO | Admitting: Internal Medicine

## 2021-02-09 ENCOUNTER — Emergency Department (HOSPITAL_BASED_OUTPATIENT_CLINIC_OR_DEPARTMENT_OTHER)

## 2021-02-09 ENCOUNTER — Emergency Department (HOSPITAL_BASED_OUTPATIENT_CLINIC_OR_DEPARTMENT_OTHER): Admitting: Radiology

## 2021-02-09 ENCOUNTER — Emergency Department (HOSPITAL_BASED_OUTPATIENT_CLINIC_OR_DEPARTMENT_OTHER)
Admission: EM | Admit: 2021-02-09 | Discharge: 2021-02-09 | Disposition: A | Discharge status: Home / Self Care | Attending: Emergency Medicine | Admitting: Emergency Medicine

## 2021-02-09 ENCOUNTER — Other Ambulatory Visit: Payer: Self-pay

## 2021-02-09 ENCOUNTER — Encounter (HOSPITAL_BASED_OUTPATIENT_CLINIC_OR_DEPARTMENT_OTHER): Payer: Self-pay | Admitting: Emergency Medicine

## 2021-02-09 DIAGNOSIS — S0091XA Abrasion of unspecified part of head, initial encounter: Secondary | ICD-10-CM | POA: Diagnosis not present

## 2021-02-09 DIAGNOSIS — S51012A Laceration without foreign body of left elbow, initial encounter: Secondary | ICD-10-CM | POA: Insufficient documentation

## 2021-02-09 DIAGNOSIS — F03C18 Unspecified dementia, severe, with other behavioral disturbance: Secondary | ICD-10-CM | POA: Insufficient documentation

## 2021-02-09 DIAGNOSIS — S0093XA Contusion of unspecified part of head, initial encounter: Secondary | ICD-10-CM | POA: Insufficient documentation

## 2021-02-09 DIAGNOSIS — M25512 Pain in left shoulder: Secondary | ICD-10-CM | POA: Insufficient documentation

## 2021-02-09 DIAGNOSIS — W19XXXA Unspecified fall, initial encounter: Secondary | ICD-10-CM | POA: Insufficient documentation

## 2021-02-09 DIAGNOSIS — S0990XA Unspecified injury of head, initial encounter: Secondary | ICD-10-CM | POA: Diagnosis present

## 2021-02-09 DIAGNOSIS — S0191XA Laceration without foreign body of unspecified part of head, initial encounter: Secondary | ICD-10-CM | POA: Diagnosis not present

## 2021-02-09 MED ORDER — LORAZEPAM 2 MG/ML IJ SOLN
0.5000 mg | Freq: Once | INTRAMUSCULAR | Status: AC
  Administered 2021-02-09: 0.5 mg via INTRAVENOUS
  Filled 2021-02-09: qty 1

## 2021-02-09 NOTE — Progress Notes (Signed)
[facility]) Hospitalized Hospice Patient   Lisa Bird is a current hospice patient with a terminal diagnosis of  Lewy body dementia with behavioral disturbance. [facility] will continue to follow.   Odette Fraction, MSW  [facility] Social Worker  [phone], [phone]

## 2021-02-09 NOTE — ED Notes (Signed)
Back from CT

## 2021-02-09 NOTE — ED Notes (Signed)
Pt resting normal breathing pattern ?

## 2021-02-09 NOTE — ED Notes (Signed)
Report given to staff at [facility]

## 2021-02-09 NOTE — ED Provider Notes (Signed)
[facility] EMERGENCY DEPT Provider Note   CSN: [id number] Arrival date & time: 02/09/21  8185     History Chief Complaint  Patient presents with   Lisa Bird    Lisa Bird is a 87 y.o. female.  HPI Level 5 caveat secondary to dementia 87 year old female history of orthostatic hypotension, dementia, DNR, on hospice care, presents today with her report of possible fall.  She was found on the ground by staff in her dementia unit at [facility].  They noted a contusion and laceration to the back of her head into her left elbow.  She is transported here via EMS on a long spine board.  Patient does not remember the fall and is unable to contribute to the history. Attempted to call daughter and did not have an answer.  I left a callback number for her Attempted to call [facility], initially no answer Talked with [employer] She was found laying on her side had been in her usual state of health 30 minutes prior.  Did not note any focal neurological deficit.    Past Medical History:  Diagnosis Date   Orthostatic hypotension     Patient Active Problem List   Diagnosis Date Noted   Cognitive impairment 09/26/2019   Orthostatic hypotension 09/18/2019   Educated about COVID-19 virus infection 09/18/2019    Past Surgical History:  Procedure Laterality Date   NO PAST SURGERIES       OB History   No obstetric history on file.     Family History  Problem Relation Age of Onset   Syncope episode Mother    Stroke Mother    Dementia Neg Hx     Social History   Tobacco Use   Smoking status: Never   Smokeless tobacco: Never    Home Medications Prior to Admission medications   Medication Sig Start Date End Date Taking? Authorizing Provider  furosemide (LASIX) 10 MG/ML solution Take 20 mg by mouth daily as needed for edema.    [provider]  GNP 8 HOUR ARTHRITIS RELIEF 650 MG CR tablet Take 650 mg by mouth every 8 (eight) hours as needed. 09/26/20    [provider]  haloperidol (HALDOL) 0.5 MG tablet Take 0.5 mg by mouth 2 (two) times daily as needed for agitation (or paranoia). 11/22/20   [provider]  valproic acid (DEPAKENE) 250 MG/5ML solution Take 125 mg by mouth 2 (two) times daily. 11/19/20   [provider]    Allergies    Patient has no known allergies.  Review of Systems   Review of Systems  Unable to perform ROS: Dementia   Physical Exam Updated Vital Signs BP (!) 155/71 (BP Location: Right Arm)   Pulse 90   Temp (!) 97.5 F (36.4 C) (Rectal)   Resp (!) 21   Ht 1.676 m ( )   Wt 56.7 kg   SpO2 96%   BMI 20.18 kg/m   Physical Exam Vitals and nursing note reviewed.  Constitutional:      General: She is not in acute distress.    Appearance: She is ill-appearing.  HENT:     Head: Normocephalic.     Comments: Swelling with laceration to occiput    Right Ear: External ear normal.     Left Ear: External ear normal.     Mouth/Throat:     Pharynx: Oropharynx is clear.  Eyes:     Extraocular Movements: Extraocular movements intact.     Pupils: Pupils are equal,  round, and reactive to light.  Neck:     Comments: Cervical collar in place no obvious external signs of trauma noted Cardiovascular:     Rate and Rhythm: Normal rate.     Pulses: Normal pulses.  Pulmonary:     Effort: Pulmonary effort is normal.     Breath sounds: Normal breath sounds.  Abdominal:     General: Bowel sounds are normal.     Palpations: Abdomen is soft.  Musculoskeletal:     Comments: Deformity to left shoulder with tenderness to palpation of entire upper extremity there is a laceration in the left elbow.  Is difficult to assess her upper extremities as she complains of pain with any palpation and is agitated.  She holds both upper extremities in flexion at the elbow.  I was able to range the right upper extremity Bilateral lower extremities appear normal and she is able to flex at her knees and hips No  obvious external signs of trauma are noted Back examined visually with no obvious external signs of trauma and no apparent point tenderness over cervical, thoracic, or lumbar spine.  Skin:    General: Skin is warm and dry.     Capillary Refill: Capillary refill takes less than 2 seconds.  Neurological:     General: No focal deficit present.     Mental Status: She is alert.  Psychiatric:        Mood and Affect: Affect is labile and angry.        Behavior: Behavior is agitated, aggressive and combative.        Thought Content: Thought content is paranoid.        Cognition and Memory: Cognition is impaired.        Judgment: Judgment is impulsive and inappropriate.    ED Results / Procedures / Treatments   Labs (all labs ordered are listed, but only abnormal results are displayed) Labs Reviewed  RESP PANEL BY RT-PCR (FLU A&B, COVID) [id number]    EKG None  Radiology DG Elbow Complete Left  Result Date: 02/09/2021 CLINICAL DATA:  Pain after fall. EXAM: LEFT ELBOW - COMPLETE 3+ VIEW COMPARISON:  None. FINDINGS: Left elbow is located without a fracture. No evidence for large joint effusion. Normal alignment. IMPRESSION: No acute abnormality to left elbow. Electronically Signed   By: Richarda Overlie M.D.   On: 02/09/2021 11:44   CT Head Wo Contrast  Result Date: 02/09/2021 CLINICAL DATA:  Head trauma. EXAM: CT HEAD WITHOUT CONTRAST TECHNIQUE: Contiguous axial images were obtained from the base of the skull through the vertex without intravenous contrast. COMPARISON:  November 24, 2020 FINDINGS: Brain: No evidence of acute infarction, hemorrhage, hydrocephalus, extra-axial collection or mass lesion/mass effect. Moderate brain parenchymal volume loss and deep white matter microangiopathy. Vascular: No hyperdense vessel or unexpected calcification. Skull: Normal. Negative for fracture or focal lesion. Sinuses/Orbits: No acute finding. Other: Large right posterior scalp hematoma. IMPRESSION: 1. No acute  intracranial abnormality. 2. Moderate brain parenchymal atrophy and chronic microvascular disease. 3. Large right posterior scalp hematoma. Electronically Signed   By: Ted Mcalpine M.D.   On: 02/09/2021 11:44   CT Cervical Spine Wo Contrast  Result Date: 02/09/2021 CLINICAL DATA:  Neck trauma, status post fall. EXAM: CT CERVICAL SPINE WITHOUT CONTRAST TECHNIQUE: Multidetector CT imaging of the cervical spine was performed without intravenous contrast. Multiplanar CT image reconstructions were also generated. COMPARISON:  None. FINDINGS: Alignment: Normal. Skull base and vertebrae: No acute fracture. No primary bone lesion or  focal pathologic process. Soft tissues and spinal canal: No prevertebral fluid or swelling. No visible canal hematoma. Disc levels:  Multilevel osteoarthritic changes, mild. Upper chest: Bilateral subpleural linear and nodular thickening. Other: None. IMPRESSION: 1. No evidence of acute traumatic injury to the cervical spine. 2. Multilevel osteoarthritic changes of the cervical spine. 3. Bilateral subpleural linear and nodular thickening of the lung apices. If clinically important, recommend evaluation with chest CT on a nonemergent basis. Electronically Signed   By: Ted Mcalpine M.D.   On: 02/09/2021 11:50   DG Chest Port 1 View  Result Date: 02/09/2021 CLINICAL DATA:  Pain after fall. EXAM: PORTABLE CHEST 1 VIEW COMPARISON:  11/24/2020 FINDINGS: Few densities at the right lung base could represent atelectasis or chronic changes. No significant airspace disease or consolidation. Negative for a pneumothorax. Heart size is normal and stable. Atherosclerotic calcifications at the aortic arch. Irregularity of the lateral right clavicle appears chronic and suspect old injury in this area. IMPRESSION: No acute cardiopulmonary disease. Electronically Signed   By: Richarda Overlie M.D.   On: 02/09/2021 11:47   DG Shoulder Left  Result Date: 02/09/2021 CLINICAL DATA:  Pain after  fall. EXAM: LEFT SHOULDER - 2+ VIEW COMPARISON:  Chest radiograph 11/24/2020 FINDINGS: Two views of the left shoulder were obtained. No evidence for fracture or dislocation. Normal alignment at the left [facility] joint. Visualized ribs are intact. Left clavicle is intact. IMPRESSION: No acute bone abnormality to left shoulder. Electronically Signed   By: Richarda Overlie M.D.   On: 02/09/2021 11:46    Procedures Procedures   Medications Ordered in ED Medications  LORazepam (ATIVAN) injection 0.5 mg (0.5 mg Intravenous Given 02/09/21 1104)    ED Course  I have reviewed the triage vital signs and the nursing notes.  Pertinent labs & imaging results that were available during my care of the patient were reviewed by me and considered in my medical decision making (see chart for details).  Clinical Course as of 02/09/21 1545  Sat Feb 09, 2021  1126 Patient with DNR and appears to be on hospice.  However, unable to contact daughter who is listed or [facility].,  Patient significantly hypothermic.  Work-up started [DR]    Clinical Course User Index [DR] Margarita Grizzle, MD   MDM Rules/Calculators/A&P                         Patient found on ground likely fall.  She is a hospice patient.  Her daughter does not think she would wish to have any significant interventions.  Imaging was obtained prior to speaking with daughter.  Initial rectal temp was low at 93 and labs were initiated.  However they have since been canceled.  Patient Received Ativan 0.5 assist with tolerating interventions.  She is resting comfortably.  She feels like she can get up now.  We will recheck her temperature, and ambulate with assistance.  Plan would be to send her back to her facility. Patient does not want to walk here.  She did receive 0.5 of Ativan.  I discussed this with her daughter Eber Jones.  She states that she is very stubborn.  Her daughters will come and get her.  Nursing staff aware. Final Clinical Impression(s) / ED  Diagnoses Final diagnoses:  Fall, initial encounter  Abrasion of head, initial encounter  Severe dementia with other behavioral disturbance, unspecified dementia type    Rx / DC Orders ED Discharge Orders  None        Margarita Grizzle, MD 02/09/21 [phone]

## 2021-02-09 NOTE — ED Notes (Signed)
Report called to [facility]. Receptionist stated that she thought they did not do intake on weekends. I explained that the Pt is a resident and had arrived by EMS from their facility. Resptionist stated that she would check and then the phone was disconnected.

## 2021-02-09 NOTE — ED Triage Notes (Addendum)
Pt arrived via [facility]. Unwitnessed fall. Pt found in activity room on floor by staff at approx 0900. Pt has laceration to back of head and left arm. Pt complains of pain to to left arm.Pt has Hx of dementia and does not remember falling. Pt denis N/V. Pt denis blurry vision  EMS states the Pt is NOT on thinners and not a Diabetic.   EMS vitals 180/82, Hr 80 Resp 18 98%, CBG 116.

## 2021-02-09 NOTE — ED Notes (Signed)
Pt placed on the [facility], due to a rectal temp result of 93.4.

## 2021-02-27 ENCOUNTER — Emergency Department (HOSPITAL_BASED_OUTPATIENT_CLINIC_OR_DEPARTMENT_OTHER)

## 2021-02-27 ENCOUNTER — Other Ambulatory Visit: Payer: Self-pay

## 2021-02-27 ENCOUNTER — Emergency Department (HOSPITAL_BASED_OUTPATIENT_CLINIC_OR_DEPARTMENT_OTHER)
Admission: EM | Admit: 2021-02-27 | Discharge: 2021-02-27 | Disposition: A | Discharge status: Home / Self Care | Attending: Emergency Medicine | Admitting: Emergency Medicine

## 2021-02-27 DIAGNOSIS — S0990XA Unspecified injury of head, initial encounter: Secondary | ICD-10-CM | POA: Diagnosis present

## 2021-02-27 DIAGNOSIS — W19XXXA Unspecified fall, initial encounter: Secondary | ICD-10-CM | POA: Insufficient documentation

## 2021-02-27 DIAGNOSIS — S0181XA Laceration without foreign body of other part of head, initial encounter: Secondary | ICD-10-CM | POA: Insufficient documentation

## 2021-02-27 MED ORDER — LIDOCAINE-EPINEPHRINE-TETRACAINE (LET) TOPICAL GEL
3.0000 mL | Freq: Once | TOPICAL | Status: AC
  Administered 2021-02-27: 01:00:00 3 mL via TOPICAL
  Filled 2021-02-27: qty 3

## 2021-02-27 MED ORDER — ACETAMINOPHEN 500 MG PO TABS
1000.0000 mg | ORAL_TABLET | Freq: Once | ORAL | Status: DC
  Filled 2021-02-27: qty 2

## 2021-02-27 NOTE — ED Notes (Signed)
Patients daughter called for update. Pts daughter plans to transport patient to [place].

## 2021-02-27 NOTE — ED Provider Notes (Signed)
[facility] EMERGENCY DEPT Provider Note   CSN: [id number] Arrival date & time: 02/27/21  0021     History No chief complaint on file.   Lisa Bird is a 87 y.o. female.  Pt is an 87y/o female with hx of cognitive impairment, orthostatic hypotension who lives at a memory care facility and is being brought in after an unwitnessed fall. They came to check on her and found her lying on the floor by her bed.  They report pt is at her baseline but there was a cut to her head.  Pt reports sharp pain all over her head.  No vomiting reported by staff and no recent behavior changes.  Pt does not take anticoagulation.  The history is provided by the EMS personnel and the nursing home. The history is limited by the condition of the patient and the absence of a caregiver.      Past Medical History:  Diagnosis Date   Orthostatic hypotension     Patient Active Problem List   Diagnosis Date Noted   Cognitive impairment 09/26/2019   Orthostatic hypotension 09/18/2019   Educated about COVID-19 virus infection 09/18/2019    Past Surgical History:  Procedure Laterality Date   NO PAST SURGERIES       OB History   No obstetric history on file.     Family History  Problem Relation Age of Onset   Syncope episode Mother    Stroke Mother    Dementia Neg Hx     Social History   Tobacco Use   Smoking status: Never   Smokeless tobacco: Never    Home Medications Prior to Admission medications   Medication Sig Start Date End Date Taking? Authorizing Provider  furosemide (LASIX) 10 MG/ML solution Take 20 mg by mouth daily as needed for edema.    [provider]  GNP 8 HOUR ARTHRITIS RELIEF 650 MG CR tablet Take 650 mg by mouth every 8 (eight) hours as needed. 09/26/20   [provider]  haloperidol (HALDOL) 0.5 MG tablet Take 0.5 mg by mouth 2 (two) times daily as needed for agitation (or paranoia). 11/22/20   [provider]  valproic  acid (DEPAKENE) 250 MG/5ML solution Take 125 mg by mouth 2 (two) times daily. 11/19/20   [provider]    Allergies    Patient has no known allergies.  Review of Systems   Review of Systems  Unable to perform ROS: Dementia   Physical Exam Updated Vital Signs BP (!) 149/86    Pulse 98    Temp 98.7 F (37.1 C) (Axillary)    Resp 17    Wt 53.2 kg    SpO2 100%    BMI 18.93 kg/m   Physical Exam Vitals and nursing note reviewed.  Constitutional:      General: She is not in acute distress.    Appearance: She is well-developed.  HENT:     Head: Normocephalic and atraumatic.   Eyes:     Conjunctiva/sclera: Conjunctivae normal.     Pupils: Pupils are equal, round, and reactive to light.  Cardiovascular:     Rate and Rhythm: Normal rate and regular rhythm.     Heart sounds: No murmur heard. Pulmonary:     Effort: Pulmonary effort is normal. No respiratory distress.     Breath sounds: Normal breath sounds. No wheezing or rales.  Abdominal:     General: There is no distension.     Palpations: Abdomen is soft.  Tenderness: There is no abdominal tenderness. There is no guarding or rebound.  Musculoskeletal:        General: No tenderness. Normal range of motion.       Hands:     Cervical back: Normal range of motion and neck supple. Muscular tenderness present.     Comments: Full ROM of the bilateral shoulders, elbows, wrists, hips and knees without pain.  Skin:    General: Skin is warm and dry.     Findings: No erythema or rash.  Neurological:     Mental Status: She is alert.     Sensory: No sensory deficit.     Motor: No weakness.     Comments: Awake and alert.  Answers some questions but able to follow commands.  Psychiatric:     Comments: Calm and cooperative    ED Results / Procedures / Treatments   Labs (all labs ordered are listed, but only abnormal results are displayed) Labs Reviewed - No data to display  EKG None  Radiology CT Head Wo  Contrast  Result Date: 02/27/2021 CLINICAL DATA:  Head trauma, minor (Age >= 65y); Neck trauma (Age >= 65y) EXAM: CT HEAD WITHOUT CONTRAST CT CERVICAL SPINE WITHOUT CONTRAST TECHNIQUE: Multidetector CT imaging of the head and cervical spine was performed following the standard protocol without intravenous contrast. Multiplanar CT image reconstructions of the cervical spine were also generated. COMPARISON:  02/09/2021 FINDINGS: CT HEAD FINDINGS Brain: Normal anatomic configuration. Parenchymal volume loss is commensurate with the patient's age. Moderate subcortical and periventricular white matter changes are present likely reflecting the sequela of small vessel ischemia. Remote lacunar infarcts are noted within the basal ganglia bilaterally involving the anterior limb of the internal capsules. No abnormal intra or extra-axial mass lesion or fluid collection. No abnormal mass effect or midline shift. No evidence of acute intracranial hemorrhage or infarct. Ventricular size is normal. Cerebellum unremarkable. Vascular: No asymmetric hyperdense vasculature at the skull base. Skull: Intact Sinuses/Orbits: Paranasal sinuses are clear. Orbits are unremarkable. Other: Mastoid air cells and middle ear cavities are clear. Small right parietal scalp hematoma noted. Mild left supraorbital soft tissue swelling. CT CERVICAL SPINE FINDINGS Alignment: 2 mm anterolisthesis of C3 upon C4 and 2 mm retrolisthesis of C4 upon C5 are likely degenerative in nature and are stable since prior examination. Skull base and vertebrae: Craniocervical alignment is normal. Landau dental interval is not widened. No acute fracture of the cervical spine. Subtle wedge deformity of the C7 vertebral body with associated inferior endplate fracture is stable since prior examination and appears remote in nature. Soft tissues and spinal canal: No canal hematoma. No prevertebral soft tissue swelling or fluid collection identified. Spinal canal  demonstrates mild central narrowing at C4-5 with an AP diameter of the canal of approximately 9 mm at this level. No remodeling of the thecal sac. Disc levels: There is diffuse intervertebral disc space narrowing and endplate remodeling throughout the cervical spine in keeping with changes of diffuse moderate to severe degenerative disc disease. Prevertebral soft tissues are not thickened. Review of the axial images demonstrates multilevel uncovertebral and facet arthrosis resulting in multilevel mild-to-moderate neuroforaminal narrowing, most severe on the left at C4-5. Upper chest: Mild biapical scarring. Other: None IMPRESSION: No acute intracranial injury.  No calvarial fracture. Small right parietal scalp hematoma. Mild left supraorbital soft tissue swelling. No acute fracture or listhesis of the cervical spine. Electronically Signed   By: Helyn Numbers M.D.   On: 02/27/2021 01:25   CT Cervical  Spine Wo Contrast  Result Date: 02/27/2021 CLINICAL DATA:  Head trauma, minor (Age >= 65y); Neck trauma (Age >= 65y) EXAM: CT HEAD WITHOUT CONTRAST CT CERVICAL SPINE WITHOUT CONTRAST TECHNIQUE: Multidetector CT imaging of the head and cervical spine was performed following the standard protocol without intravenous contrast. Multiplanar CT image reconstructions of the cervical spine were also generated. COMPARISON:  02/09/2021 FINDINGS: CT HEAD FINDINGS Brain: Normal anatomic configuration. Parenchymal volume loss is commensurate with the patient's age. Moderate subcortical and periventricular white matter changes are present likely reflecting the sequela of small vessel ischemia. Remote lacunar infarcts are noted within the basal ganglia bilaterally involving the anterior limb of the internal capsules. No abnormal intra or extra-axial mass lesion or fluid collection. No abnormal mass effect or midline shift. No evidence of acute intracranial hemorrhage or infarct. Ventricular size is normal. Cerebellum  unremarkable. Vascular: No asymmetric hyperdense vasculature at the skull base. Skull: Intact Sinuses/Orbits: Paranasal sinuses are clear. Orbits are unremarkable. Other: Mastoid air cells and middle ear cavities are clear. Small right parietal scalp hematoma noted. Mild left supraorbital soft tissue swelling. CT CERVICAL SPINE FINDINGS Alignment: 2 mm anterolisthesis of C3 upon C4 and 2 mm retrolisthesis of C4 upon C5 are likely degenerative in nature and are stable since prior examination. Skull base and vertebrae: Craniocervical alignment is normal. Landau dental interval is not widened. No acute fracture of the cervical spine. Subtle wedge deformity of the C7 vertebral body with associated inferior endplate fracture is stable since prior examination and appears remote in nature. Soft tissues and spinal canal: No canal hematoma. No prevertebral soft tissue swelling or fluid collection identified. Spinal canal demonstrates mild central narrowing at C4-5 with an AP diameter of the canal of approximately 9 mm at this level. No remodeling of the thecal sac. Disc levels: There is diffuse intervertebral disc space narrowing and endplate remodeling throughout the cervical spine in keeping with changes of diffuse moderate to severe degenerative disc disease. Prevertebral soft tissues are not thickened. Review of the axial images demonstrates multilevel uncovertebral and facet arthrosis resulting in multilevel mild-to-moderate neuroforaminal narrowing, most severe on the left at C4-5. Upper chest: Mild biapical scarring. Other: None IMPRESSION: No acute intracranial injury.  No calvarial fracture. Small right parietal scalp hematoma. Mild left supraorbital soft tissue swelling. No acute fracture or listhesis of the cervical spine. Electronically Signed   By: Helyn Numbers M.D.   On: 02/27/2021 01:25    Procedures Procedures  LACERATION REPAIR Performed by: [employer] [facility] Authorized by: Gwyneth Sprout Consent:  Verbal consent obtained. Risks and benefits: risks, benefits and alternatives were discussed Consent given by: patient Patient identity confirmed: provided demographic data Prepped and Draped in normal sterile fashion Wound explored  Laceration Location: left forehead  Laceration Length: 3cm and 1cm   No Foreign Bodies seen or palpated  Anesthesia: local  Local anesthetic: LET  Anesthetic total: 1 ml  Irrigation method: syringe Amount of cleaning: standard  Skin closure: 6.0 prolene and dermabond  Number of sutures: 6  Technique: simple interrupted  Patient tolerance: Patient tolerated the procedure well with no immediate complications.   Medications Ordered in ED Medications  acetaminophen (TYLENOL) tablet 1,000 mg (1,000 mg Oral Not Given 02/27/21 0051)  lidocaine-EPINEPHrine-tetracaine (LET) topical gel (3 mLs Topical Given 02/27/21 0049)    ED Course  I have reviewed the triage vital signs and the nursing notes.  Pertinent labs & imaging results that were available during my care of the patient were reviewed by me  and considered in my medical decision making (see chart for details).    MDM Rules/Calculators/A&P                         Pt with an unwitnessed fall tonight at her facility. Lying next to her bed.  She is awake and alert here.  No anticoagulation.  No report of recent change in behavior or repetitive falls.  Pt does have laceration and contusion to the left eyebrow/forehead region.  She has a small skin tear to the left hand/forearm but no other signs of injury.  Head/cervical spine imaging neg.  Last tetanus unknown.  Will update today.  Wound repaired as above.  VS reassuring and pt well appearing.  Will d/c back to facility.  MDM   Amount and/or Complexity of Data Reviewed Tests in the radiology section of CPT: ordered and reviewed Independent visualization of images, tracings, or specimens: yes       Final Clinical Impression(s) / ED  Diagnoses Final diagnoses:  Fall, initial encounter  Laceration of forehead without complication, initial encounter    Rx / DC Orders ED Discharge Orders     None        Gwyneth Sprout, MD 02/27/21 [phone]

## 2021-02-27 NOTE — ED Notes (Signed)
Pt dc home with her daughter by private vehicle. Reviewed dc instructions with patient and her daughter. Daughter voiced understanding.

## 2021-02-27 NOTE — Discharge Instructions (Signed)
Stitches need to be removed in 5-7 days (there are 6 stitches).  CAT scan is normal.  If she develops vomiting, recurrent falls or lethargy return for evaluation.

## 2021-02-27 NOTE — ED Triage Notes (Signed)
Pt came to ED via EMS s/p unwitnessed fall from bed. A&Ox1 (baseline), oriented to self. Pt is moving all 4 extremities equally, VSS, pupils are equal and reactive. 2 lacerations noted above the left eye, bleeding controlled by EMS bandage, and skin tear to the anterior proximal side of the left forearm. No blood thinners.

## 2021-02-27 NOTE — ED Notes (Signed)
Patient refuses to let staff clean her hair or hands of dried blood.

## 2021-03-25 ENCOUNTER — Emergency Department (HOSPITAL_BASED_OUTPATIENT_CLINIC_OR_DEPARTMENT_OTHER): Admitting: Radiology

## 2021-03-25 ENCOUNTER — Emergency Department (HOSPITAL_BASED_OUTPATIENT_CLINIC_OR_DEPARTMENT_OTHER)

## 2021-03-25 ENCOUNTER — Encounter (HOSPITAL_BASED_OUTPATIENT_CLINIC_OR_DEPARTMENT_OTHER): Payer: Self-pay | Admitting: Emergency Medicine

## 2021-03-25 ENCOUNTER — Emergency Department (HOSPITAL_BASED_OUTPATIENT_CLINIC_OR_DEPARTMENT_OTHER)
Admission: EM | Admit: 2021-03-25 | Discharge: 2021-03-25 | Disposition: A | Discharge status: Home / Self Care | Attending: Emergency Medicine | Admitting: Emergency Medicine

## 2021-03-25 ENCOUNTER — Other Ambulatory Visit: Payer: Self-pay

## 2021-03-25 DIAGNOSIS — Y92008 Other place in unspecified non-institutional (private) residence as the place of occurrence of the external cause: Secondary | ICD-10-CM | POA: Diagnosis not present

## 2021-03-25 DIAGNOSIS — F0390 Unspecified dementia without behavioral disturbance: Secondary | ICD-10-CM | POA: Insufficient documentation

## 2021-03-25 DIAGNOSIS — W19XXXA Unspecified fall, initial encounter: Secondary | ICD-10-CM | POA: Diagnosis not present

## 2021-03-25 DIAGNOSIS — S0180XA Unspecified open wound of other part of head, initial encounter: Secondary | ICD-10-CM | POA: Diagnosis not present

## 2021-03-25 DIAGNOSIS — R7309 Other abnormal glucose: Secondary | ICD-10-CM | POA: Diagnosis not present

## 2021-03-25 DIAGNOSIS — M25552 Pain in left hip: Secondary | ICD-10-CM | POA: Diagnosis not present

## 2021-03-25 DIAGNOSIS — S0990XA Unspecified injury of head, initial encounter: Secondary | ICD-10-CM | POA: Insufficient documentation

## 2021-03-25 LAB — CBG MONITORING, ED: Glucose-Capillary: 74 mg/dL (ref 70–99)

## 2021-03-25 NOTE — ED Notes (Signed)
S1 S2 Heard, cap refill good

## 2021-03-25 NOTE — ED Notes (Signed)
RT Note: V/S updated, T-96.4 (Temporal), RN Joe @ bedside.

## 2021-03-25 NOTE — ED Provider Notes (Signed)
[facility] EMERGENCY DEPT Provider Note   CSN: [id number] Arrival date & time: 03/25/21  7939     History  Chief Complaint  Patient presents with   Fall   Head Injury    Lisa Bird is a 88 y.o. female.  88 year old female with history of dementia presents after unwitnessed fall from facility.  Found to have abrasion to the left side of her forehead.  Apparently she is at her baseline.  Does have a history orthostatic hypotension but her blood pressure stable here.  Patient does endorse pain with movement of her left hip.      Home Medications Prior to Admission medications   Medication Sig Start Date End Date Taking? Authorizing Provider  furosemide (LASIX) 10 MG/ML solution Take 20 mg by mouth daily as needed for edema.    [provider]  GNP 8 HOUR ARTHRITIS RELIEF 650 MG CR tablet Take 650 mg by mouth every 8 (eight) hours as needed. 09/26/20   [provider]  haloperidol (HALDOL) 0.5 MG tablet Take 0.5 mg by mouth 2 (two) times daily as needed for agitation (or paranoia). 11/22/20   [provider]  valproic acid (DEPAKENE) 250 MG/5ML solution Take 125 mg by mouth 2 (two) times daily. 11/19/20   [provider]      Allergies    Patient has no known allergies.    Review of Systems   Review of Systems  Unable to perform ROS: Dementia   Physical Exam Updated Vital Signs BP (!) 157/79 (BP Location: Right Arm)    Pulse 67    Temp (!) 97.1 F (36.2 C) (Oral)    Resp (!) 22    Wt 53.2 kg    SpO2 100%    BMI 18.93 kg/m  Physical Exam Vitals and nursing note reviewed.  Constitutional:      General: She is not in acute distress.    Appearance: Normal appearance. She is well-developed. She is not toxic-appearing.  HENT:     Head:   Eyes:     General: Lids are normal.     Conjunctiva/sclera: Conjunctivae normal.     Pupils: Pupils are equal, round, and reactive to light.  Neck:     Thyroid: No thyroid mass.      Trachea: No tracheal deviation.  Cardiovascular:     Rate and Rhythm: Normal rate and regular rhythm.     Heart sounds: Normal heart sounds. No murmur heard.   No gallop.  Pulmonary:     Effort: Pulmonary effort is normal. No respiratory distress.     Breath sounds: Normal breath sounds. No stridor. No decreased breath sounds, wheezing, rhonchi or rales.  Abdominal:     General: There is no distension.     Palpations: Abdomen is soft.     Tenderness: There is no abdominal tenderness. There is no rebound.  Musculoskeletal:        General: Normal range of motion.     Cervical back: Normal range of motion and neck supple.     Left hip: Tenderness present. No deformity. Normal range of motion.  Skin:    General: Skin is warm and dry.     Findings: No abrasion or rash.  Neurological:     Mental Status: She is alert. Mental status is at baseline. She is disoriented and confused.     GCS: GCS eye subscore is 4. GCS verbal subscore is 5. GCS motor subscore is 6.  Cranial Nerves: No cranial nerve deficit.     Sensory: No sensory deficit.     Motor: Motor function is intact.  Psychiatric:        Attention and Perception: Attention normal.        Speech: Speech normal.        Behavior: Behavior normal.    ED Results / Procedures / Treatments   Labs (all labs ordered are listed, but only abnormal results are displayed) Labs Reviewed - No data to display  EKG EKG Interpretation  Date/Time:  Monday March 25 2021 08:11:38 EST Ventricular Rate:  71 PR Interval:  139 QRS Duration: 95 QT Interval:  401 QTC Calculation: 436 R Axis:   83 Text Interpretation: Sinus rhythm Borderline right axis deviation Borderline repolarization abnormality Confirmed by Lorre Nick ([id number]) on 03/25/2021 9:24:21 AM  Radiology No results found.  Procedures Procedures    Medications Ordered in ED Medications - No data to display  ED Course/ Medical Decision Making/ A&P                            Medical Decision Making Old records reviewed.  Patient's wound is an avulsion and not suturable.  CT of head and cervical spine without acute findings.  CBG is 74.  Pelvis x-ray negative for fracture.  Patient is comfortable here.  Vital signs are stable and no indication for laboratory studies or hospitalization at this time.  Tetanus status updated.  Patient is EKG per my interpretation shows no evidence of ischemia or severely prolonged QT intervals.  Will discharge back to facility  Amount and/or Complexity of Data Reviewed Independent Historian: EMS           Final Clinical Impression(s) / ED Diagnoses Final diagnoses:  None    Rx / DC Orders ED Discharge Orders     None         Lorre Nick, MD 03/25/21 [phone]

## 2021-03-25 NOTE — ED Notes (Signed)
RT Note: V/S updated, Joe, RN made aware of results.

## 2021-03-25 NOTE — ED Notes (Signed)
Redressed wound.

## 2021-03-25 NOTE — ED Triage Notes (Signed)
Pt arrives from [facility]  after a fall BIB [facility] , pt is confused , has abrasion to left herahead over her eye , bleeding  controlled at this time

## 2021-05-08 DEATH — deceased

## 2023-12-09 IMAGING — CT CT HEAD W/O CM
4 series · 16 of 47 positions shown, 18 images · non-contrast
Comparison: 02/27/2021

CLINICAL DATA: Fall



[Series 2: head wo · axial · 0.43mm/px · z∈[+911,+1031]mm · 7 of 32 slices shown, 9 images]
[im 4/32  brain]
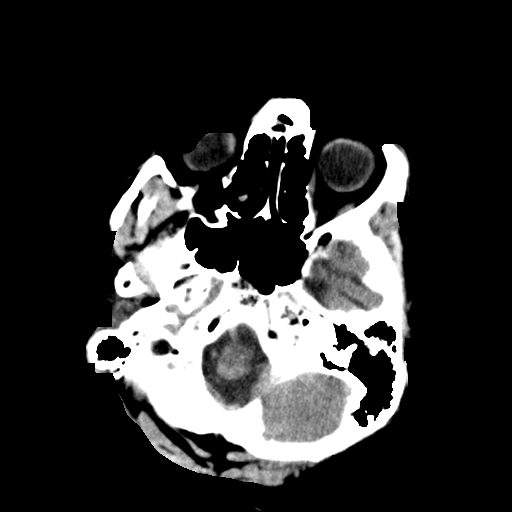
[im 4/32  bone]
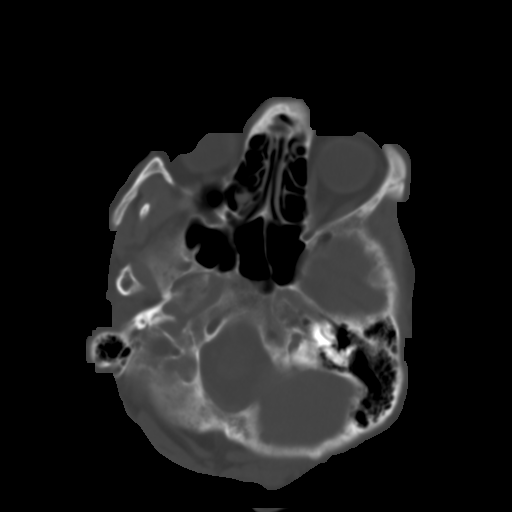
[im 8/32  brain]
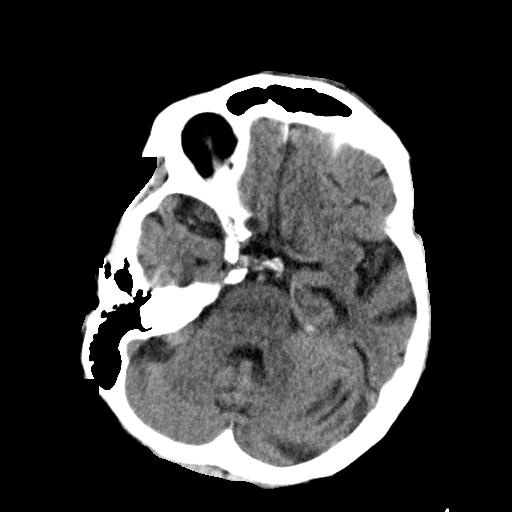
[im 12/32  brain]
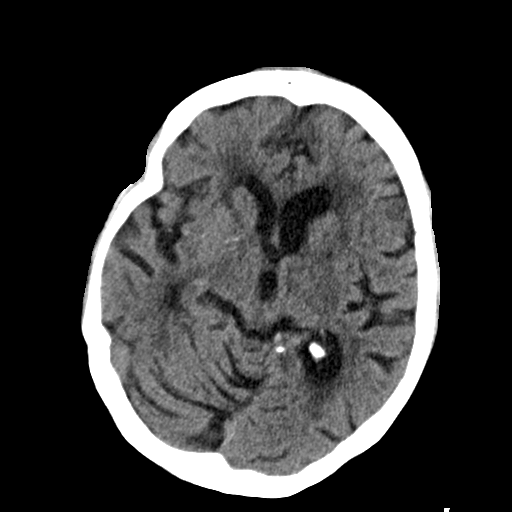
[im 16/32  brain]
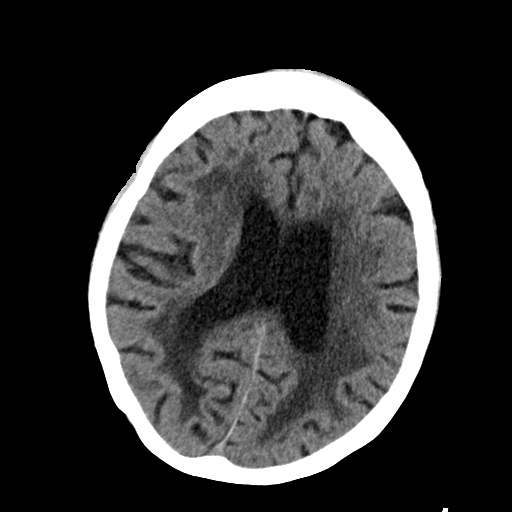
[im 20/32  brain]
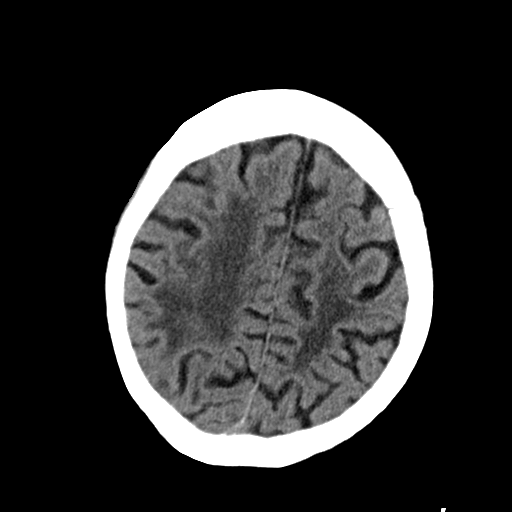
[im 20/32  bone]
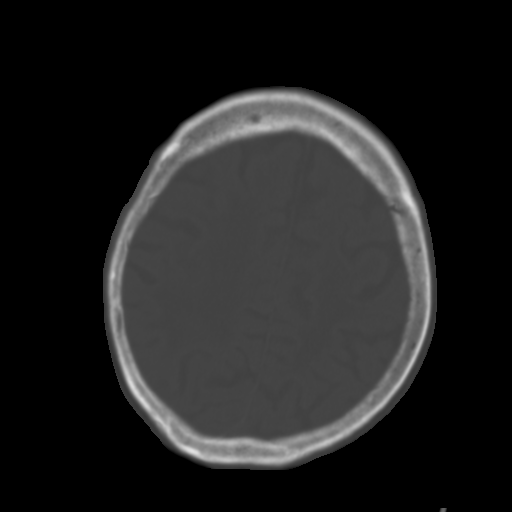
[im 24/32  brain]
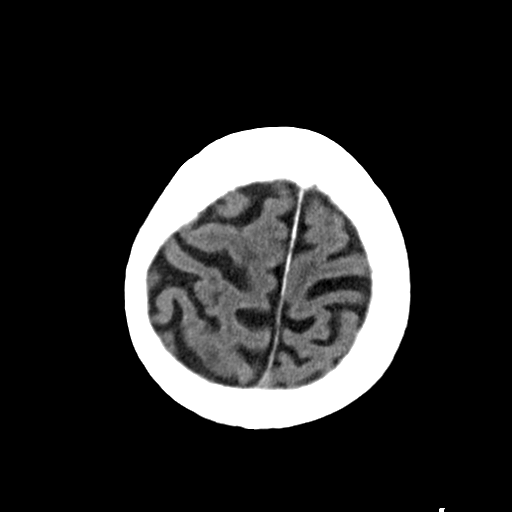
[im 28/32  brain]
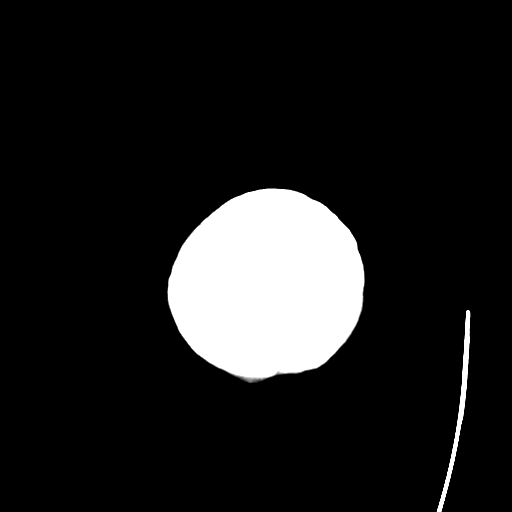

[Series 3: head bone · axial · 0.43mm/px · z∈[+910,+942]mm · 3 of 78 slices shown]
[im 8/78  bone]
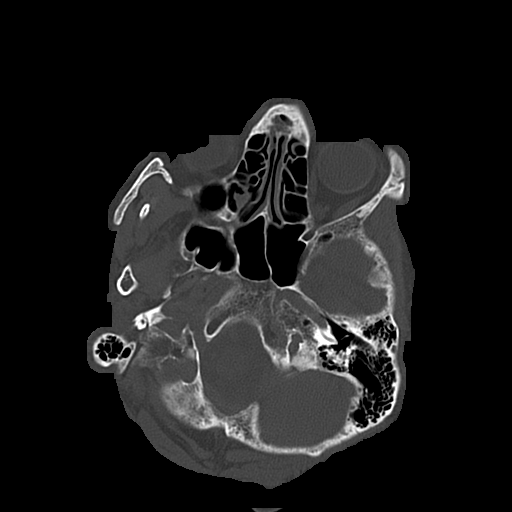
[im 16/78  bone]
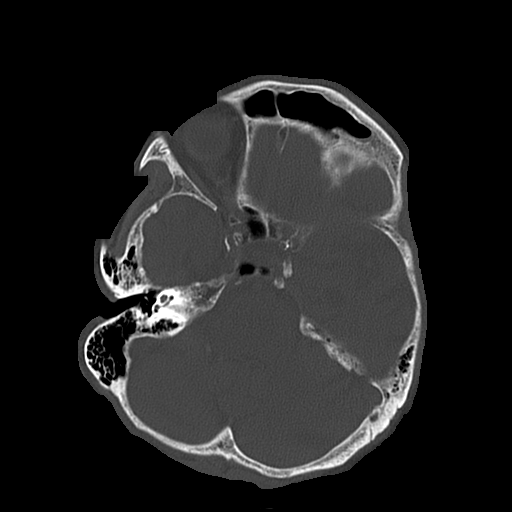
[im 24/78  bone]
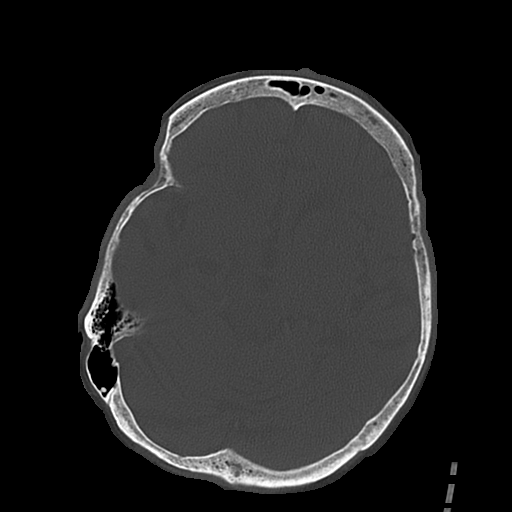

[Series 4: coronal soft · coronal · 0.33mm/px · 3 of 70 slices shown]
[im 24/70  brain]
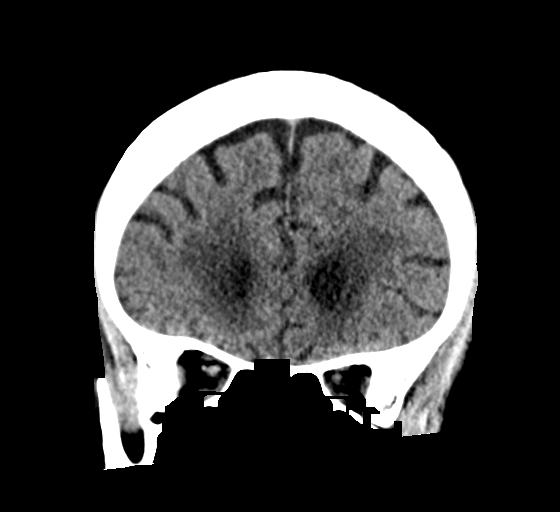
[im 31/70  brain]
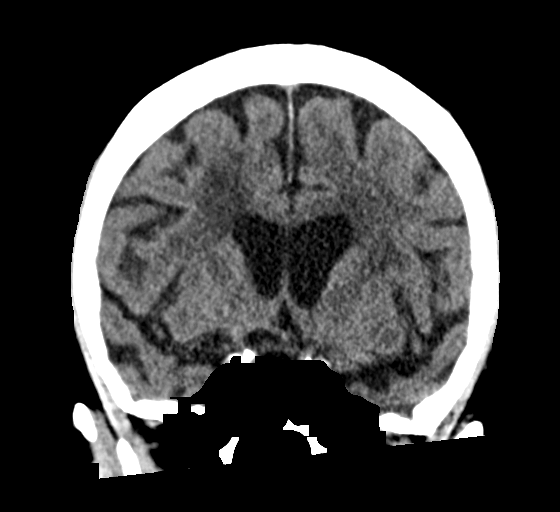
[im 39/70  brain]
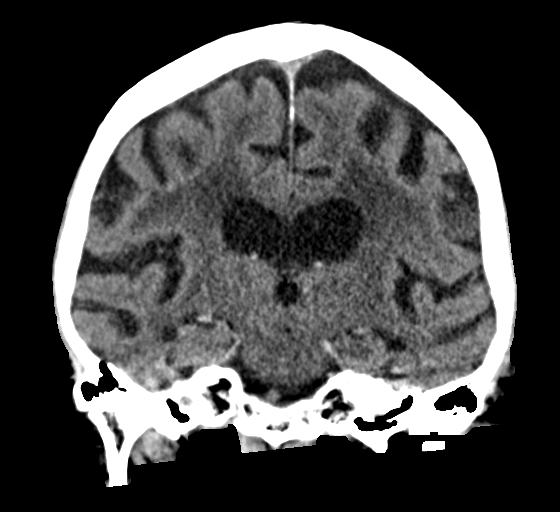

[Series 5: sagittal soft · sagittal · 0.33mm/px · 3 of 62 slices shown]
[im 21/62  brain]
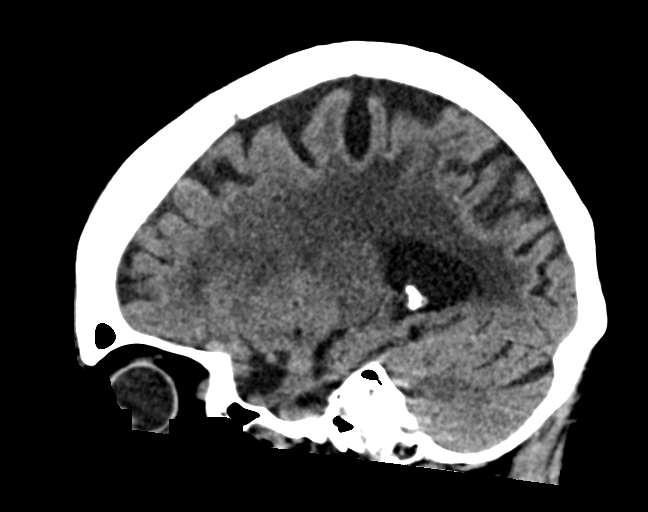
[im 31/62  brain]
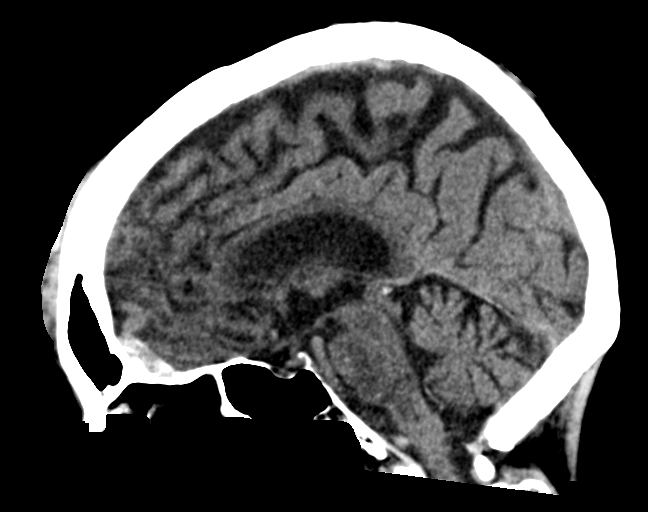
[im 41/62  brain]
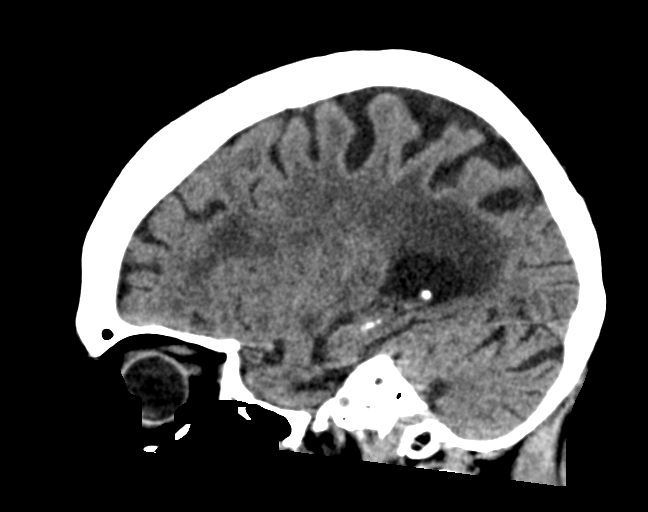

[16 of 47 positions shown; findings below may reference images not displayed]

FINDINGS: CT HEAD FINDINGS

Brain: No evidence of acute infarction, hemorrhage, hydrocephalus,
extra-axial collection or mass lesion/mass effect. Extensive
periventricular and deep white matter hypodensity.

Vascular: No hyperdense vessel or unexpected calcification.

Skull: Normal. Negative for fracture or focal lesion.

Sinuses/Orbits: No acute finding.

Other: Soft tissue laceration and hematoma of the midline forehead
(series 2, image 13)

CT CERVICAL SPINE FINDINGS

Alignment: Degenerative straightening of the normal cervical
lordosis.

Skull base and vertebrae: No acute fracture. No primary bone lesion
or focal pathologic process.

Soft tissues and spinal canal: No prevertebral fluid or swelling. No
visible canal hematoma.

Disc levels: Moderate multilevel disc space height loss and
osteophytosis.

Upper chest: Negative.

Other: None.
IMPRESSION: 1. No acute intracranial pathology. Advanced small-vessel white
matter disease.
2. Soft tissue laceration and hematoma of the midline forehead.
3. No fracture or static subluxation of the cervical spine.
4. Moderate multilevel cervical disc degenerative disease.
